# Patient Record
Sex: Female | Born: 1985 | Race: White | Hispanic: No | Marital: Single | Smoking: Former smoker
Health system: Southern US, Community
[De-identification: ages and names within clinical notes are randomized; demographics above are authoritative.]

## PROBLEM LIST (undated history)

## (undated) DIAGNOSIS — T7840XA Allergy, unspecified, initial encounter: Secondary | ICD-10-CM

## (undated) DIAGNOSIS — G8929 Other chronic pain: Secondary | ICD-10-CM

## (undated) DIAGNOSIS — G4700 Insomnia, unspecified: Secondary | ICD-10-CM

## (undated) DIAGNOSIS — M542 Cervicalgia: Secondary | ICD-10-CM

## (undated) DIAGNOSIS — J45909 Unspecified asthma, uncomplicated: Secondary | ICD-10-CM

## (undated) DIAGNOSIS — D649 Anemia, unspecified: Secondary | ICD-10-CM

## (undated) DIAGNOSIS — M797 Fibromyalgia: Secondary | ICD-10-CM

## (undated) DIAGNOSIS — E063 Autoimmune thyroiditis: Secondary | ICD-10-CM

## (undated) DIAGNOSIS — K219 Gastro-esophageal reflux disease without esophagitis: Secondary | ICD-10-CM

## (undated) DIAGNOSIS — E079 Disorder of thyroid, unspecified: Secondary | ICD-10-CM

## (undated) DIAGNOSIS — M5136 Other intervertebral disc degeneration, lumbar region: Secondary | ICD-10-CM

## (undated) DIAGNOSIS — S060XAA Concussion with loss of consciousness status unknown, initial encounter: Secondary | ICD-10-CM

## (undated) DIAGNOSIS — F419 Anxiety disorder, unspecified: Secondary | ICD-10-CM

## (undated) DIAGNOSIS — M51369 Other intervertebral disc degeneration, lumbar region without mention of lumbar back pain or lower extremity pain: Secondary | ICD-10-CM

## (undated) HISTORY — DX: Anxiety disorder, unspecified: F41.9

## (undated) HISTORY — PX: UPPER GI ENDOSCOPY: SHX6162

## (undated) HISTORY — DX: Autoimmune thyroiditis: E06.3

## (undated) HISTORY — DX: Allergy, unspecified, initial encounter: T78.40XA

## (undated) HISTORY — DX: Unspecified asthma, uncomplicated: J45.909

## (undated) HISTORY — DX: Insomnia, unspecified: G47.00

## (undated) HISTORY — DX: Concussion with loss of consciousness status unknown, initial encounter: S06.0XAA

## (undated) HISTORY — PX: TONSILLECTOMY: SUR1361

## (undated) HISTORY — PX: OVARIAN CYST SURGERY: SHX726

## (undated) HISTORY — DX: Fibromyalgia: M79.7

## (undated) HISTORY — PX: OTHER SURGICAL HISTORY: SHX169

---
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
## 2009-05-24 ENCOUNTER — Encounter: Admission: RE | Admit: 2009-05-24 | Discharge: 2009-05-24 | Payer: Self-pay | Admitting: Family Medicine

## 2009-12-01 DIAGNOSIS — G8929 Other chronic pain: Secondary | ICD-10-CM | POA: Insufficient documentation

## 2009-12-03 ENCOUNTER — Ambulatory Visit (HOSPITAL_COMMUNITY): Admission: RE | Admit: 2009-12-03 | Discharge: 2009-12-03 | Payer: Self-pay | Admitting: Gastroenterology

## 2009-12-21 ENCOUNTER — Encounter: Admission: RE | Admit: 2009-12-21 | Discharge: 2009-12-21 | Payer: Self-pay | Admitting: Gastroenterology

## 2010-01-01 DIAGNOSIS — N83209 Unspecified ovarian cyst, unspecified side: Secondary | ICD-10-CM | POA: Insufficient documentation

## 2010-01-01 DIAGNOSIS — K900 Celiac disease: Secondary | ICD-10-CM | POA: Insufficient documentation

## 2010-01-18 ENCOUNTER — Ambulatory Visit (HOSPITAL_COMMUNITY): Admission: RE | Admit: 2010-01-18 | Discharge: 2010-01-19 | Payer: Self-pay | Admitting: Obstetrics and Gynecology

## 2010-02-21 ENCOUNTER — Emergency Department (HOSPITAL_COMMUNITY): Admission: EM | Admit: 2010-02-21 | Discharge: 2010-02-21 | Payer: Self-pay | Admitting: Emergency Medicine

## 2011-02-20 LAB — COMPREHENSIVE METABOLIC PANEL
Alkaline Phosphatase: 52 U/L (ref 39–117)
BUN: 6 mg/dL (ref 6–23)
Chloride: 108 mEq/L (ref 96–112)
GFR calc non Af Amer: >60 mL/min (ref >60)
Glucose, Bld: 97 mg/dL (ref 70–99)
Potassium: 4.4 mEq/L (ref 3.5–5.1)
Total Bilirubin: 0.8 mg/dL (ref 0.3–1.2)

## 2011-02-20 LAB — CBC
HCT: 25.6 % — ABNORMAL LOW (ref 36.0–46.0)
HCT: 40.8 % (ref 36.0–46.0)
Hemoglobin: 10.1 g/dL — ABNORMAL LOW (ref 12.0–15.0)
Hemoglobin: 13.8 g/dL (ref 12.0–15.0)
Hemoglobin: 8.6 g/dL — ABNORMAL LOW (ref 12.0–15.0)
Hemoglobin: 9.6 g/dL — ABNORMAL LOW (ref 12.0–15.0)
MCHC: 33.5 g/dL (ref 30.0–36.0)
MCHC: 33.7 g/dL (ref 30.0–36.0)
MCV: 96.2 fL (ref 78.0–100.0)
MCV: 97.8 fL (ref 78.0–100.0)
Platelets: 152 10*3/uL (ref 150–400)
Platelets: 208 10*3/uL (ref 150–400)
RBC: 2.62 MIL/uL — ABNORMAL LOW (ref 3.87–5.11)
RBC: 2.99 MIL/uL — ABNORMAL LOW (ref 3.87–5.11)
RDW: 12.8 % (ref 11.5–15.5)
WBC: 11.4 10*3/uL — ABNORMAL HIGH (ref 4.0–10.5)
WBC: 5.1 10*3/uL (ref 4.0–10.5)
WBC: 6.3 10*3/uL (ref 4.0–10.5)

## 2011-02-20 LAB — TYPE AND SCREEN: ABO/RH(D): O POS

## 2011-02-20 LAB — PREGNANCY, URINE: Preg Test, Ur: NEGATIVE

## 2011-02-20 LAB — ABO/RH: ABO/RH(D): O POS

## 2011-02-24 LAB — DIFFERENTIAL
Basophils Absolute: 0 10*3/uL (ref 0.0–0.1)
Basophils Relative: 0 % (ref 0–1)
Eosinophils Absolute: 0.3 10*3/uL (ref 0.0–0.7)
Eosinophils Relative: 6 % — ABNORMAL HIGH (ref 0–5)
Lymphocytes Relative: 29 % (ref 12–46)
Lymphs Abs: 1.5 10*3/uL (ref 0.7–4.0)
Monocytes Absolute: 0.4 10*3/uL (ref 0.1–1.0)
Monocytes Relative: 7 % (ref 3–12)
Neutro Abs: 2.9 10*3/uL (ref 1.7–7.7)
Neutrophils Relative %: 58 % (ref 43–77)

## 2011-02-24 LAB — COMPREHENSIVE METABOLIC PANEL
ALT: 22 U/L (ref 0–35)
AST: 25 U/L (ref 0–37)
Albumin: 4.3 g/dL (ref 3.5–5.2)
Alkaline Phosphatase: 65 U/L (ref 39–117)
BUN: 10 mg/dL (ref 6–23)
CO2: 26 mEq/L (ref 19–32)
Calcium: 9.4 mg/dL (ref 8.4–10.5)
Chloride: 108 mEq/L (ref 96–112)
Creatinine, Ser: 0.47 mg/dL (ref 0.4–1.2)
GFR calc Af Amer: >60 mL/min (ref >60)
GFR calc non Af Amer: >60 mL/min (ref >60)
Glucose, Bld: 99 mg/dL (ref 70–99)
Potassium: 4.1 mEq/L (ref 3.5–5.1)
Sodium: 137 mEq/L (ref 135–145)
Total Bilirubin: 0.4 mg/dL (ref 0.3–1.2)
Total Protein: 7.2 g/dL (ref 6.0–8.3)

## 2011-02-24 LAB — POCT PREGNANCY, URINE: Preg Test, Ur: NEGATIVE

## 2011-02-24 LAB — CBC
HCT: 38.4 % (ref 36.0–46.0)
Hemoglobin: 12.7 g/dL (ref 12.0–15.0)
MCHC: 33.1 g/dL (ref 30.0–36.0)
MCV: 96.2 fL (ref 78.0–100.0)
Platelets: 234 10*3/uL (ref 150–400)
RBC: 3.99 MIL/uL (ref 3.87–5.11)
RDW: 13.5 % (ref 11.5–15.5)
WBC: 5 10*3/uL (ref 4.0–10.5)

## 2011-02-24 LAB — URINE MICROSCOPIC-ADD ON

## 2011-02-24 LAB — URINALYSIS, ROUTINE W REFLEX MICROSCOPIC
Bilirubin Urine: NEGATIVE
Glucose, UA: NEGATIVE mg/dL
Ketones, ur: NEGATIVE mg/dL
Protein, ur: NEGATIVE mg/dL

## 2011-02-24 LAB — LIPASE, BLOOD: Lipase: 22 U/L (ref 11–59)

## 2012-06-13 ENCOUNTER — Ambulatory Visit: Payer: BC Managed Care – PPO

## 2012-06-13 ENCOUNTER — Ambulatory Visit (INDEPENDENT_AMBULATORY_CARE_PROVIDER_SITE_OTHER): Payer: BC Managed Care – PPO | Admitting: Emergency Medicine

## 2012-06-13 VITALS — BP 100/60 | HR 73 | Temp 98.0°F | Resp 16 | Ht 68.0 in | Wt 151.6 lb

## 2012-06-13 DIAGNOSIS — R000 Tachycardia, unspecified: Secondary | ICD-10-CM

## 2012-06-13 DIAGNOSIS — J45909 Unspecified asthma, uncomplicated: Secondary | ICD-10-CM

## 2012-06-13 DIAGNOSIS — F411 Generalized anxiety disorder: Secondary | ICD-10-CM

## 2012-06-13 DIAGNOSIS — F419 Anxiety disorder, unspecified: Secondary | ICD-10-CM

## 2012-06-13 MED ORDER — IPRATROPIUM-ALBUTEROL 0.5-2.5 (3) MG/3ML IN SOLN: 3.0000 mL | Freq: Four times a day (QID) | RESPIRATORY_TRACT | Status: DC | PRN

## 2012-06-13 NOTE — Progress Notes (Signed)
  Subjective:    Patient ID: Victoria Larsen, female    DOB: 1986/03/02, 26 y.o.   MRN: [id number]  HPI 26 year old female presents with chest congestion started 4 pm yesterday. Patient could not sleep last night. Productive cough started this morning. History of asthma. Rapid heartbeat. Used nebulizer twice yesterday with no relief.  Wears binder around chest to reduce breast size. Does not sleep in binder  Review of Systems     Objective:   Physical Exam HEENT exam is unremarkable her neck was supple her chest was clear to my auscultation. Heart was regular rate without murmurs  [facility] reading (PRIMARY) by  Dr.Treonna Klee chest x-ray shows no acute disease  EKG is normal       Assessment & Plan:   Testing patient going through a number of issues. She has moved here from [facility] plan to move to [place]. We'll plan on an evaluation by an aggressive therapies. She will continue her Xanax as needed and prescription for duo nebs called in for episodes of bronchospasm.

## 2012-07-21 ENCOUNTER — Ambulatory Visit (INDEPENDENT_AMBULATORY_CARE_PROVIDER_SITE_OTHER): Payer: BC Managed Care – PPO | Admitting: Family Medicine

## 2012-07-21 VITALS — BP 82/66 | HR 69 | Temp 97.7°F | Resp 16 | Ht 68.18 in | Wt 148.6 lb

## 2012-07-21 DIAGNOSIS — N760 Acute vaginitis: Secondary | ICD-10-CM

## 2012-07-21 DIAGNOSIS — J0110 Acute frontal sinusitis, unspecified: Secondary | ICD-10-CM

## 2012-07-21 DIAGNOSIS — R350 Frequency of micturition: Secondary | ICD-10-CM

## 2012-07-21 DIAGNOSIS — Z7251 High risk heterosexual behavior: Secondary | ICD-10-CM

## 2012-07-21 LAB — POCT URINALYSIS DIPSTICK
Nitrite, UA: NEGATIVE
Spec Grav, UA: >=1.03
pH, UA: 5.5

## 2012-07-21 LAB — POCT UA - MICROSCOPIC ONLY
Casts, Ur, LPF, POC: NEGATIVE
Crystals, Ur, HPF, POC: NEGATIVE

## 2012-07-21 LAB — POCT WET PREP WITH KOH
RBC Wet Prep HPF POC: NEGATIVE
Yeast Wet Prep HPF POC: NEGATIVE

## 2012-07-21 MED ORDER — AMBULATORY NON FORMULARY MEDICATION: Status: DC

## 2012-07-21 MED ORDER — AMOXICILLIN-POT CLAVULANATE 875-125 MG PO TABS: 1.0000 | ORAL_TABLET | Freq: Two times a day (BID) | ORAL | Status: AC

## 2012-07-21 MED ORDER — METRONIDAZOLE 0.75 % VA GEL: 1.0000 | Freq: Two times a day (BID) | VAGINAL | Status: DC

## 2012-07-21 NOTE — Progress Notes (Signed)
Subjective:    Patient ID: Victoria Larsen, female    DOB: [birth year], 26 y.o.   MRN: [id number]  HPI This 26 y.o. female presents for evaluation of the following:    1.  Cold symptoms:  Onset June 2013; intermittent symptoms; worsening over past month; really congested in chest.  No fever/chills/sweats.  +HA; +sinus pressure; +ST intermittent; +raspy voice; +runny nose; +nasal congestion; +PND.  +no cough; no sputum production; taking Mucinex PRN.  Mild wheezing; history of asthma; increased Albuterol use; using twice per week.  No v/d.  Taking Loratadine daily; using nasal spray daily.  2.  Vaginal discharge:  Onset five days ago; itching and pain; urine is really cloudy and strong odor.  More dryness.  No change in soaps.  Sexually active; new partner.  Female partner.  No unusual lubricants recently.  No dysuria.  +redness mild.  History of recurrent yeast infections.  No OTC.  Requesting STD screening.  No female partners.    No history of STDs.  History of recurrent BV also.  Had unusual reaction to H. Pylori treatment in past; felt side effect to oral Metronidazole; has used Metrogel vaginally without difficulty in past.  No use of boric acid suppositories.  PMH: fibromyalgia, asthma, allergic rhinitis, insomnia, anxiety Other providers: [facility]. Psur: ovarian cyst rupture 2011. All:  Metronidazole Meds:  Dulera, Albuterol, nebulizer, Xanax PRN, Ambien. Social:    Single; dating casually; working yard work; no tobacco; no alcohol; no drugs; +exercise.    Review of Systems  Constitutional: Negative for fever, chills and diaphoresis.  HENT: Positive for congestion, rhinorrhea, sneezing and postnasal drip. Negative for ear pain, nosebleeds, facial swelling, neck pain and ear discharge.   Eyes: Negative for discharge and itching.  Respiratory: Negative for cough, shortness of breath and wheezing.   Genitourinary: Positive for vaginal pain and pelvic pain. Negative for  dysuria, urgency, frequency, hematuria, flank pain, vaginal bleeding, vaginal discharge, genital sores and menstrual problem.       +VAGINAL ITCHING       Objective:   Physical Exam  Nursing note and vitals reviewed. Constitutional: She is oriented to person, place, and time. She appears well-developed and well-nourished. No distress.  HENT:  Head: Normocephalic and atraumatic.  Right Ear: External ear normal.  Left Ear: External ear normal.  Nose: Nose normal.  Mouth/Throat: Oropharynx is clear and moist. No oropharyngeal exudate.       +TTP FRONTAL SINUS.  Eyes: Conjunctivae and EOM are normal. Pupils are equal, round, and reactive to light.  Neck: Normal range of motion. Neck supple. No thyromegaly present.  Cardiovascular: Normal rate, regular rhythm and normal heart sounds.   No murmur heard. Pulmonary/Chest: Effort normal and breath sounds normal. No respiratory distress. She has no wheezes.  Abdominal: Soft. Bowel sounds are normal. She exhibits no distension and no mass. There is no tenderness. There is no rebound and no guarding.  Genitourinary: Uterus normal. Vaginal discharge found.       MILD ERYTHEMA LABIA MINORA; MILD SCANT VAGINAL DISCHARGE CLEAR-WHITE-YELLOW WITH MILD ODOR; NO CMT; NO ADNEXAL MASSES OR TTP.  Lymphadenopathy:    She has no cervical adenopathy.  Neurological: She is alert and oriented to person, place, and time. No cranial nerve deficit. She exhibits normal muscle tone.  Skin: Skin is warm and dry.  Psychiatric: She has a normal mood and affect. Her behavior is normal. Judgment and thought content normal.       Results for orders placed in visit  on 07/21/12  POCT URINALYSIS DIPSTICK      Component Value Range   Color, UA yellow     Clarity, UA cloudy     Glucose, UA neg     Bilirubin, UA small     Ketones, UA neg     Spec Grav, UA >=1.030     Blood, UA trace     pH, UA 5.5     Protein, UA 30     Urobilinogen, UA 0.2     Nitrite, UA neg      Leukocytes, UA Negative    POCT UA - MICROSCOPIC ONLY      Component Value Range   WBC, Ur, HPF, POC 2-3     RBC, urine, microscopic 0-1     Bacteria, U Microscopic 2+     Mucus, UA large     Epithelial cells, urine per micros 7-14     Crystals, Ur, HPF, POC neg     Casts, Ur, LPF, POC neg     Yeast, UA neg    POCT WET PREP WITH KOH      Component Value Range   Trichomonas, UA Negative     Clue Cells Wet Prep HPF POC 6-10     Epithelial Wet Prep HPF POC 6-10     Yeast Wet Prep HPF POC neg     Bacteria Wet Prep HPF POC 3+     RBC Wet Prep HPF POC neg     WBC Wet Prep HPF POC 4-8     KOH Prep POC Negative      Assessment & Plan:   1. Frequency of urination  POCT urinalysis dipstick, POCT UA - Microscopic Only, Urine culture, POCT Wet Prep with KOH  2. High risk sexual behavior  GC/chlamydia probe amp, genital, HIV antibody, RPR  3. Vaginitis and vulvovaginitis  AMBULATORY NON FORMULARY MEDICATION  4. Sinusitis, acute frontal  CBC with Differential, amoxicillin-clavulanate (AUGMENTIN) 875-125 MG per tablet   1.  High Risk Sexual Behavior/STD screening: new.  Obtain GC/Chlam, RPR, HIV.  HIV counseling provided during visit and pt expressed understanding. 2.  Vulvovaginitis/BV: New and recurrent issue for patient.  Treat with Metrogel vaginal suppositories.  Possible side effect to oral Metronidazole but questionable; pt to call if develops symptoms with Metrogel vaginal cream.  Also provided with rx for Boric acid suppositories.   3.  Acute frontal sinusitis: New.  Rx for Augmentin provided.  Continue antihistamine, nasal spray.    Meds ordered this encounter  Medications  . zolpidem (AMBIEN) 10 MG tablet    Sig: Take 10 mg by mouth at bedtime as needed.  Marland Kitchen amoxicillin-clavulanate (AUGMENTIN) 875-125 MG per tablet    Sig: Take 1 tablet by mouth 2 (two) times daily.    Dispense:  20 tablet    Refill:  0  . metroNIDAZOLE (METROGEL VAGINAL) 0.75 % vaginal gel    Sig: Place 1  Applicatorful vaginally 2 (two) times daily.    Dispense:  70 g    Refill:  0  . AMBULATORY NON FORMULARY MEDICATION    Sig: Boric Acid Suppository 600 mg Insert 1 PV twice weekly to maintain vaginal flora    Dispense:  10 suppository    Refill:  5

## 2012-07-21 NOTE — Patient Instructions (Addendum)
Bacterial Vaginosis Bacterial vaginosis (BV) is a vaginal infection where the normal balance of bacteria in the vagina is disrupted. The normal balance is then replaced by an overgrowth of certain bacteria. There are several different kinds of bacteria that can cause BV. BV is the most common vaginal infection in women of childbearing age. CAUSES   The cause of BV is not fully understood. BV develops when there is an increase or imbalance of harmful bacteria.   Some activities or behaviors can upset the normal balance of bacteria in the vagina and put women at increased risk including:   Having a new sex partner or multiple sex partners.   Douching.   Using an intrauterine device (IUD) for contraception.   It is not clear what role sexual activity plays in the development of BV. However, women that have never had sexual intercourse are rarely infected with BV.  Women do not get BV from toilet seats, bedding, swimming pools or from touching objects around them.  SYMPTOMS   Grey vaginal discharge.   A fish-like odor with discharge, especially after sexual intercourse.   Itching or burning of the vagina and vulva.   Burning or pain with urination.   Some women have no signs or symptoms at all.  DIAGNOSIS  Your caregiver must examine the vagina for signs of BV. Your caregiver will perform lab tests and look at the sample of vaginal fluid through a microscope. They will look for bacteria and abnormal cells (clue cells), a pH test higher than 4.5, and a positive amine test all associated with BV.  RISKS AND COMPLICATIONS   Pelvic inflammatory disease (PID).   Infections following gynecology surgery.   Developing HIV.   Developing herpes virus.  TREATMENT  Sometimes BV will clear up without treatment. However, all women with symptoms of BV should be treated to avoid complications, especially if gynecology surgery is planned. Female partners generally do not need to be treated. However,  BV may spread between female sex partners so treatment is helpful in preventing a recurrence of BV.   BV may be treated with antibiotics. The antibiotics come in either pill or vaginal cream forms. Either can be used with nonpregnant or pregnant women, but the recommended dosages differ. These antibiotics are not harmful to the baby.   BV can recur after treatment. If this happens, a second round of antibiotics will often be prescribed.   Treatment is important for pregnant women. If not treated, BV can cause a premature delivery, especially for a pregnant woman who had a premature birth in the past. All pregnant women who have symptoms of BV should be checked and treated.   For chronic reoccurrence of BV, treatment with a type of prescribed gel vaginally twice a week is helpful.  HOME CARE INSTRUCTIONS   Finish all medication as directed by your caregiver.   Do not have sex until treatment is completed.   Tell your sexual partner that you have a vaginal infection. They should see their caregiver and be treated if they have problems, such as a mild rash or itching.   Practice safe sex. Use condoms. Only have 1 sex partner.  PREVENTION  Basic prevention steps can help reduce the risk of upsetting the natural balance of bacteria in the vagina and developing BV:  Do not have sexual intercourse (be abstinent).   Do not douche.   Use all of the medicine prescribed for treatment of BV, even if the signs and symptoms go away.     Tell your sex partner if you have BV. That way, they can be treated, if needed, to prevent reoccurrence.  SEEK MEDICAL CARE IF:   Your symptoms are not improving after 3 days of treatment.   You have increased discharge, pain, or fever.  MAKE SURE YOU:   Understand these instructions.   Will watch your condition.   Will get help right away if you are not doing well or get worse.  FOR MORE INFORMATION  Division of STD Prevention (DSTDP), Centers for Disease  Control and Prevention: www.cdc.gov/std American Social Health Association (ASHA): www.ashastd.org  Document Released: 11/17/2005 Document Revised: 11/06/2011 Document Reviewed: 05/10/2009 Exit[employer] Patient Information 2012 [employer]. 

## 2012-07-22 ENCOUNTER — Telehealth: Payer: Self-pay

## 2012-07-22 LAB — CBC WITH DIFFERENTIAL/PLATELET
Eosinophils Relative: 8 % — ABNORMAL HIGH (ref 0–5)
HCT: 40.7 % (ref 36.0–46.0)
Hemoglobin: 14 g/dL (ref 12.0–15.0)
Lymphocytes Relative: 33 % (ref 12–46)
Lymphs Abs: 2 10*3/uL (ref 0.7–4.0)
MCV: 92.9 fL (ref 78.0–100.0)
Monocytes Absolute: 0.6 10*3/uL (ref 0.1–1.0)
Neutro Abs: 2.9 10*3/uL (ref 1.7–7.7)
RBC: 4.38 MIL/uL (ref 3.87–5.11)
WBC: 5.9 10*3/uL (ref 4.0–10.5)

## 2012-07-22 LAB — HIV ANTIBODY (ROUTINE TESTING W REFLEX): HIV: NONREACTIVE

## 2012-07-22 NOTE — Telephone Encounter (Signed)
1. High Risk Sexual Behavior/STD screening: new. Obtain GC/Chlam, RPR, HIV. HIV counseling provided during visit and pt expressed understanding.  2. Vulvovaginitis/BV: New and recurrent issue for patient. Treat with Metrogel vaginal suppositories. Possible side effect to oral Metronidazole but questionable; pt to call if develops symptoms with Metrogel vaginal cream. Also provided with rx for Boric acid suppositories.  3. Acute frontal sinusitis: New. Rx for Augmentin provided. Continue antihistamine, nasal spray.   From office visit yesterday please advise.

## 2012-07-22 NOTE — Telephone Encounter (Signed)
Pt saw Dr. Katrinka Blazing yesterday who prescribed metroNIDAZOLE (METROGEL VAGINAL) 0.75 % vaginal gel [[id number]]. She has had an allergic rxn to the gel before and Dr. Katrinka Blazing said she could prescribe something different if she would prefer.  Pt has changed her mind and would like the alternative.  Best [phone]  [facility] [address], [place] - [address]

## 2012-07-22 NOTE — Progress Notes (Signed)
Reviewed and agree.

## 2012-07-22 NOTE — Telephone Encounter (Signed)
Note says she had a possible reaction to ORAL metronidazole but that she used the gel without problem. Please clarify what the patient wants.

## 2012-07-23 LAB — URINE CULTURE

## 2012-07-23 NOTE — Telephone Encounter (Signed)
Pt states that she is having a problem with with the gel that was prescribed and would like something else prescribed. Best# [phone]

## 2012-07-25 MED ORDER — CLINDAMYCIN PHOSPHATE 100 MG VA SUPP: 100.0000 mg | Freq: Every day | VAGINAL | Status: AC

## 2012-07-25 MED ORDER — MOMETASONE FURO-FORMOTEROL FUM 100-5 MCG/ACT IN AERO: 2.0000 | INHALATION_SPRAY | Freq: Two times a day (BID) | RESPIRATORY_TRACT | Status: DC

## 2012-07-25 NOTE — Telephone Encounter (Signed)
LMOM RX sent in. 

## 2012-07-25 NOTE — Telephone Encounter (Signed)
Clindamycin vaginal suppository sent.

## 2017-12-22 ENCOUNTER — Ambulatory Visit: Payer: BC Managed Care – PPO | Admitting: Emergency Medicine

## 2017-12-22 ENCOUNTER — Other Ambulatory Visit: Payer: Self-pay

## 2017-12-22 ENCOUNTER — Encounter: Payer: Self-pay | Admitting: Emergency Medicine

## 2017-12-22 VITALS — BP 96/64 | HR 91 | Temp 98.4°F | Resp 16 | Ht 68.25 in | Wt 163.0 lb

## 2017-12-22 DIAGNOSIS — Z8709 Personal history of other diseases of the respiratory system: Secondary | ICD-10-CM | POA: Diagnosis not present

## 2017-12-22 DIAGNOSIS — R6889 Other general symptoms and signs: Secondary | ICD-10-CM | POA: Diagnosis not present

## 2017-12-22 DIAGNOSIS — J111 Influenza due to unidentified influenza virus with other respiratory manifestations: Secondary | ICD-10-CM

## 2017-12-22 LAB — POC INFLUENZA A&B (BINAX/QUICKVUE)
INFLUENZA A, POC: NEGATIVE
INFLUENZA B, POC: NEGATIVE

## 2017-12-22 MED ORDER — AZITHROMYCIN 250 MG PO TABS: ORAL_TABLET | ORAL | 0 refills | Status: DC

## 2017-12-22 MED ORDER — PREDNISONE 20 MG PO TABS: 40.0000 mg | ORAL_TABLET | Freq: Every day | ORAL | 0 refills | Status: AC

## 2017-12-22 MED ORDER — HYDROCODONE-ACETAMINOPHEN 5-325 MG PO TABS: 1.0000 | ORAL_TABLET | Freq: Four times a day (QID) | ORAL | 0 refills | Status: DC | PRN

## 2017-12-22 NOTE — Patient Instructions (Addendum)
     IF you received an x-ray today, you will receive an invoice from [facility]. Please contact [facility] at [phone] with questions or concerns regarding your invoice.   IF you received labwork today, you will receive an invoice from [facility]. Please contact [facility] at [phone] with questions or concerns regarding your invoice.   Our billing staff will not be able to assist you with questions regarding bills from these companies.  You will be contacted with the lab results as soon as they are available. The fastest way to get your results is to activate your My Chart account. Instructions are located on the last page of this paperwork. If you have not heard from us regarding the results in 2 weeks, please contact this office.      Influenza, Adult Influenza ("the flu") is an infection in the lungs, nose, and throat (respiratory tract). It is caused by a virus. The flu causes many common cold symptoms, as well as a high fever and body aches. It can make you feel very sick. The flu spreads easily from person to person (is contagious). Getting a flu shot (influenza vaccination) every year is the best way to prevent the flu. Follow these instructions at home:  Take over-the-counter and prescription medicines only as told by your doctor.  Use a cool mist humidifier to add moisture (humidity) to the air in your home. This can make it easier to breathe.  Rest as needed.  Drink enough fluid to keep your pee (urine) clear or pale yellow.  Cover your mouth and nose when you cough or sneeze.  Wash your hands with soap and water often, especially after you cough or sneeze. If you cannot use soap and water, use hand sanitizer.  Stay home from work or school as told by your doctor. Unless you are visiting your doctor, try to avoid leaving home until your fever has been gone for 24 hours without the use of medicine.  Keep all follow-up visits as told by your doctor.  This is important. How is this prevented?  Getting a yearly (annual) flu shot is the best way to avoid getting the flu. You may get the flu shot in late summer, fall, or winter. Ask your doctor when you should get your flu shot.  Wash your hands often or use hand sanitizer often.  Avoid contact with people who are sick during cold and flu season.  Eat healthy foods.  Drink plenty of fluids.  Get enough sleep.  Exercise regularly. Contact a doctor if:  You get new symptoms.  You have:  Chest pain.  Watery poop (diarrhea).  A fever.  Your cough gets worse.  You start to have more mucus.  You feel sick to your stomach (nauseous).  You throw up (vomit). Get help right away if:  You start to be short of breath or have trouble breathing.  Your skin or nails turn a bluish color.  You have very bad pain or stiffness in your neck.  You get a sudden headache.  You get sudden pain in your face or ear.  You cannot stop throwing up. This information is not intended to replace advice given to you by your health care provider. Make sure you discuss any questions you have with your health care provider. Document Released: 08/26/2008 Document Revised: 04/24/2016 Document Reviewed: 09/11/2015 Elsevier Interactive Patient Education  2017 [employer]  

## 2017-12-22 NOTE — Progress Notes (Signed)
Victoria Larsen 32 y.o.   Chief Complaint  Patient presents with  . Cough    2 days ago - productive pale yellow  . Sore Throat    with body ache  . Establish Care    HISTORY OF PRESENT ILLNESS: This is a 32 y.o. female complaining of acute onset flu like symptoms 2 days ago.  Influenza  This is a new problem. The current episode started in the past 7 days. The problem has been rapidly worsening. Associated symptoms include chills, congestion, coughing, fatigue, a fever, headaches, myalgias, a sore throat and weakness. Pertinent negatives include no abdominal pain, arthralgias, chest pain, nausea, neck pain, rash, swollen glands, urinary symptoms or vomiting.     Prior to Admission medications   Medication Sig Start Date End Date Taking? Authorizing Provider  albuterol (PROVENTIL HFA;VENTOLIN HFA) 108 (90 BASE) MCG/ACT inhaler Inhale 2 puffs into the lungs every 6 (six) hours as needed.   Yes [provider]  albuterol (PROVENTIL) (2.5 MG/3ML) 0.083% nebulizer solution Take 2.5 mg by nebulization every 6 (six) hours as needed.   Yes [provider]  AMBULATORY NON FORMULARY MEDICATION Boric Acid Suppository 600 mg Insert 1 PV twice weekly to maintain vaginal flora 07/21/12  Yes Ethelda Chick, MD  cyclobenzaprine (FLEXERIL) 10 MG tablet Take 10 mg by mouth as needed for muscle spasms.   Yes [provider]  ipratropium-albuterol (DUONEB) 0.5-2.5 (3) MG/3ML SOLN Take 3 mLs by nebulization every 6 (six) hours as needed. 06/13/12  Yes Collene Gobble, MD  ALPRAZolam Prudy Feeler) 0.5 MG tablet Take 0.5 mg by mouth at bedtime as needed.    [provider]  mometasone-formoterol (DULERA) 100-5 MCG/ACT AERO Inhale 2 puffs into the lungs 2 (two) times daily. Patient not taking: Reported on 12/22/2017 07/25/12   Porfirio Oar, PA-C  zolpidem (AMBIEN) 10 MG tablet Take 10 mg by mouth at bedtime as needed.    [provider]    Allergies  Allergen  Reactions  . Metronidazole   . Penicillins Rash    infant    There are no active problems to display for this patient.   Past Medical History:  Diagnosis Date  . Allergy   . Anxiety   . Asthma   . Fibromyalgia   . Insomnia     Past Surgical History:  Procedure Laterality Date  . OVARIAN CYST SURGERY      Social History   Socioeconomic History  . Marital status: Single    Spouse name: Not on file  . Number of children: Not on file  . Years of education: Not on file  . Highest education level: Not on file  Social Needs  . Financial resource strain: Not on file  . Food insecurity - worry: Not on file  . Food insecurity - inability: Not on file  . Transportation needs - medical: Not on file  . Transportation needs - non-medical: Not on file  Occupational History  . Not on file  Tobacco Use  . Smoking status: Former Games developer  . Smokeless tobacco: Never Used  Substance and Sexual Activity  . Alcohol use: No  . Drug use: No  . Sexual activity: Yes    Comment: Same sex partner  Other Topics Concern  . Not on file  Social History Narrative   Marital status: single; dating same sexual partner casually.  Children: none.  Employment: Landscaping.  Tobacco: none  Alcohol: none Drugs: none.  Sexually active: yes.  No family history on file.   Review of Systems  Constitutional: Positive for chills, fatigue and fever.  HENT: Positive for congestion and sore throat. Negative for ear pain.   Respiratory: Positive for cough.   Cardiovascular: Negative for chest pain.  Gastrointestinal: Negative for abdominal pain, nausea and vomiting.  Genitourinary: Negative for dysuria and hematuria.  Musculoskeletal: Positive for myalgias. Negative for arthralgias and neck pain.  Skin: Negative for rash.  Neurological: Positive for weakness and headaches. Negative for dizziness.  Endo/Heme/Allergies: Negative.   All other systems reviewed and are negative.   Vitals:   12/22/17  1640  BP: 96/64  Pulse: 91  Resp: 16  Temp: 98.4 F (36.9 C)  SpO2: 97%    Physical Exam  Constitutional: She is oriented to person, place, and time. She appears well-developed and well-nourished.  HENT:  Head: Normocephalic and atraumatic.  Mouth/Throat: Uvula is midline. No oropharyngeal exudate, posterior oropharyngeal edema or tonsillar abscesses.  Eyes: Conjunctivae and EOM are normal. Pupils are equal, round, and reactive to light.  Neck: Normal range of motion. Neck supple.  Cardiovascular: Normal rate, regular rhythm and normal heart sounds.  Pulmonary/Chest: Effort normal and breath sounds normal.  Abdominal: Soft. Bowel sounds are normal.  Musculoskeletal: Normal range of motion. She exhibits no edema.  Lymphadenopathy:    She has cervical adenopathy.  Neurological: She is alert and oriented to person, place, and time. No sensory deficit. She exhibits normal muscle tone.  Skin: Skin is warm and dry. Capillary refill takes less than 2 seconds.  Psychiatric: She has a normal mood and affect. Her behavior is normal.  Vitals reviewed.    ASSESSMENT & PLAN:  Victoria Larsen was seen today for cough, sore throat and establish care.  Diagnoses and all orders for this visit:  Influenza with respiratory manifestation -     azithromycin (ZITHROMAX) 250 MG tablet; Sig as indicated  Flu-like symptoms -     POC Influenza A&B(BINAX/QUICKVUE) -     HYDROcodone-acetaminophen (NORCO) 5-325 MG tablet; Take 1 tablet by mouth every 6 (six) hours as needed for moderate pain.  History of asthma -     predniSONE (DELTASONE) 20 MG tablet; Take 2 tablets (40 mg total) by mouth daily with breakfast for 5 days.  Other orders -     Cancel: Tdap vaccine greater than or equal to 7yo IM     Patient Instructions       IF you received an x-ray today, you will receive an invoice from [facility]. Please contact [facility] at [phone] with questions or concerns regarding  your invoice.   IF you received labwork today, you will receive an invoice from [facility]. Please contact [facility] at [phone] with questions or concerns regarding your invoice.   Our billing staff will not be able to assist you with questions regarding bills from these companies.  You will be contacted with the lab results as soon as they are available. The fastest way to get your results is to activate your My Chart account. Instructions are located on the last page of this paperwork. If you have not heard from [place] regarding the results in 2 weeks, please contact this office.     Influenza, Adult Influenza ("the flu") is an infection in the lungs, nose, and throat (respiratory tract). It is caused by a virus. The flu causes many common cold symptoms, as well as a high fever and body aches. It can make you feel very sick. The flu spreads easily  from person to person (is contagious). Getting a flu shot (influenza vaccination) every year is the best way to prevent the flu. Follow these instructions at home:  Take over-the-counter and prescription medicines only as told by your doctor.  Use a cool mist humidifier to add moisture (humidity) to the air in your home. This can make it easier to breathe.  Rest as needed.  Drink enough fluid to keep your pee (urine) clear or pale yellow.  Cover your mouth and nose when you cough or sneeze.  Wash your hands with soap and water often, especially after you cough or sneeze. If you cannot use soap and water, use hand sanitizer.  Stay home from work or school as told by your doctor. Unless you are visiting your doctor, try to avoid leaving home until your fever has been gone for 24 hours without the use of medicine.  Keep all follow-up visits as told by your doctor. This is important. How is this prevented?  Getting a yearly (annual) flu shot is the best way to avoid getting the flu. You may get the flu shot in late summer, fall, or winter. Ask  your doctor when you should get your flu shot.  Wash your hands often or use hand sanitizer often.  Avoid contact with people who are sick during cold and flu season.  Eat healthy foods.  Drink plenty of fluids.  Get enough sleep.  Exercise regularly. Contact a doctor if:  You get new symptoms.  You have: ? Chest pain. ? Watery poop (diarrhea). ? A fever.  Your cough gets worse.  You start to have more mucus.  You feel sick to your stomach (nauseous).  You throw up (vomit). Get help right away if:  You start to be short of breath or have trouble breathing.  Your skin or nails turn a bluish color.  You have very bad pain or stiffness in your neck.  You get a sudden headache.  You get sudden pain in your face or ear.  You cannot stop throwing up. This information is not intended to replace advice given to you by your health care provider. Make sure you discuss any questions you have with your health care provider. Document Released: 08/26/2008 Document Revised: 04/24/2016 Document Reviewed: 09/11/2015 Elsevier Interactive Patient Education  2017 Elsevier Inc.     Edwina Barth, MD [facility]

## 2018-01-29 ENCOUNTER — Other Ambulatory Visit: Payer: Self-pay

## 2018-01-29 ENCOUNTER — Encounter: Payer: Self-pay | Admitting: Physician Assistant

## 2018-01-29 ENCOUNTER — Ambulatory Visit (INDEPENDENT_AMBULATORY_CARE_PROVIDER_SITE_OTHER): Payer: BC Managed Care – PPO | Admitting: Physician Assistant

## 2018-01-29 VITALS — BP 98/60 | HR 76 | Temp 97.9°F | Resp 16 | Ht 68.0 in | Wt 161.8 lb

## 2018-01-29 DIAGNOSIS — Z130 Encounter for screening for diseases of the blood and blood-forming organs and certain disorders involving the immune mechanism: Secondary | ICD-10-CM

## 2018-01-29 DIAGNOSIS — Z124 Encounter for screening for malignant neoplasm of cervix: Secondary | ICD-10-CM | POA: Diagnosis not present

## 2018-01-29 DIAGNOSIS — Z1329 Encounter for screening for other suspected endocrine disorder: Secondary | ICD-10-CM | POA: Diagnosis not present

## 2018-01-29 DIAGNOSIS — Z13220 Encounter for screening for lipoid disorders: Secondary | ICD-10-CM

## 2018-01-29 DIAGNOSIS — B9689 Other specified bacterial agents as the cause of diseases classified elsewhere: Secondary | ICD-10-CM

## 2018-01-29 DIAGNOSIS — N898 Other specified noninflammatory disorders of vagina: Secondary | ICD-10-CM

## 2018-01-29 DIAGNOSIS — N760 Acute vaginitis: Secondary | ICD-10-CM

## 2018-01-29 DIAGNOSIS — Z23 Encounter for immunization: Secondary | ICD-10-CM | POA: Diagnosis not present

## 2018-01-29 DIAGNOSIS — Z0000 Encounter for general adult medical examination without abnormal findings: Secondary | ICD-10-CM | POA: Diagnosis not present

## 2018-01-29 LAB — POCT WET + KOH PREP
Trich by wet prep: ABSENT
Yeast by KOH: ABSENT
Yeast by wet prep: ABSENT

## 2018-01-29 LAB — POCT URINALYSIS DIP (MANUAL ENTRY)
Bilirubin, UA: NEGATIVE
Glucose, UA: NEGATIVE mg/dL
Ketones, POC UA: NEGATIVE mg/dL
Leukocytes, UA: NEGATIVE
Nitrite, UA: NEGATIVE
Protein Ur, POC: NEGATIVE mg/dL
Spec Grav, UA: 1.02 (ref 1.010–1.025)
Urobilinogen, UA: 0.2 U/dL
pH, UA: 5.5 (ref 5.0–8.0)

## 2018-01-29 MED ORDER — CLINDAMYCIN HCL 300 MG PO CAPS: 300.0000 mg | ORAL_CAPSULE | Freq: Two times a day (BID) | ORAL | 0 refills | Status: AC

## 2018-01-29 MED ORDER — FLUCONAZOLE 150 MG PO TABS: 150.0000 mg | ORAL_TABLET | Freq: Once | ORAL | 0 refills | Status: AC

## 2018-01-29 NOTE — Patient Instructions (Addendum)
You are positive for BV. Start taking clindamycin (300 mg twice daily for seven days).   If you get a yeast infection after this treatment, I have written you a prescription for diflucan.   Thank you for coming in today. I hope you feel we met your needs.  Feel free to call PCP if you have any questions or further requests.  Please consider signing up for MyChart if you do not already have it, as this is a great way to communicate with me.  Best,  Whitney McVey, PA-C  Health Maintenance, Female Adopting a healthy lifestyle and getting preventive care can go a long way to promote health and wellness. Talk with your health care provider about what schedule of regular examinations is right for you. This is a good chance for you to check in with your provider about disease prevention and staying healthy. In between checkups, there are plenty of things you can do on your own. Experts have done a lot of research about which lifestyle changes and preventive measures are most likely to keep you healthy. Ask your health care provider for more information. Weight and diet Eat a healthy diet  Be sure to include plenty of vegetables, fruits, low-fat dairy products, and lean protein.  Do not eat a lot of foods high in solid fats, added sugars, or salt.  Get regular exercise. This is one of the most important things you can do for your health. ? Most adults should exercise for at least 150 minutes each week. The exercise should increase your heart rate and make you sweat (moderate-intensity exercise). ? Most adults should also do strengthening exercises at least twice a week. This is in addition to the moderate-intensity exercise.  Maintain a healthy weight  Body mass index (BMI) is a measurement that can be used to identify possible weight problems. It estimates body fat based on height and weight. Your health care provider can help determine your BMI and help you achieve or maintain a healthy  weight.  For females 65 years of age and older: ? A BMI below 18.5 is considered underweight. ? A BMI of 18.5 to 24.9 is normal. ? A BMI of 25 to 29.9 is considered overweight. ? A BMI of 30 and above is considered obese.  Watch levels of cholesterol and blood lipids  You should start having your blood tested for lipids and cholesterol at 30 years of age, then have this test every 5 years.  You may need to have your cholesterol levels checked more often if: ? Your lipid or cholesterol levels are high. ? You are older than 30 years of age. ? You are at high risk for heart disease.  Cancer screening Lung Cancer  Lung cancer screening is recommended for adults 15-34 years old who are at high risk for lung cancer because of a history of smoking.  A yearly low-dose CT scan of the lungs is recommended for people who: ? Currently smoke. ? Have quit within the past 15 years. ? Have at least a 30-pack-year history of smoking. A pack year is smoking an average of one pack of cigarettes a day for 1 year.  Yearly screening should continue until it has been 15 years since you quit.  Yearly screening should stop if you develop a health problem that would prevent you from having lung cancer treatment.  Breast Cancer  Practice breast self-awareness. This means understanding how your breasts normally appear and feel.  It also means doing  regular breast self-exams. Let your health care provider know about any changes, no matter how small.  If you are in your 20s or 30s, you should have a clinical breast exam (CBE) by a health care provider every 1-3 years as part of a regular health exam.  If you are 48 or older, have a CBE every year. Also consider having a breast X-ray (mammogram) every year.  If you have a family history of breast cancer, talk to your health care provider about genetic screening.  If you are at high risk for breast cancer, talk to your health care provider about having an  MRI and a mammogram every year.  Breast cancer gene (BRCA) assessment is recommended for women who have family members with BRCA-related cancers. BRCA-related cancers include: ? Breast. ? Ovarian. ? Tubal. ? Peritoneal cancers.  Results of the assessment will determine the need for genetic counseling and BRCA1 and BRCA2 testing.  Cervical Cancer Your health care provider may recommend that you be screened regularly for cancer of the pelvic organs (ovaries, uterus, and vagina). This screening involves a pelvic examination, including checking for microscopic changes to the surface of your cervix (Pap test). You may be encouraged to have this screening done every 3 years, beginning at age 63.  For women ages 28-65, health care providers may recommend pelvic exams and Pap testing every 3 years, or they may recommend the Pap and pelvic exam, combined with testing for human papilloma virus (HPV), every 5 years. Some types of HPV increase your risk of cervical cancer. Testing for HPV may also be done on women of any age with unclear Pap test results.  Other health care providers may not recommend any screening for nonpregnant women who are considered low risk for pelvic cancer and who do not have symptoms. Ask your health care provider if a screening pelvic exam is right for you.  If you have had past treatment for cervical cancer or a condition that could lead to cancer, you need Pap tests and screening for cancer for at least 20 years after your treatment. If Pap tests have been discontinued, your risk factors (such as having a new sexual partner) need to be reassessed to determine if screening should resume. Some women have medical problems that increase the chance of getting cervical cancer. In these cases, your health care provider may recommend more frequent screening and Pap tests.  Colorectal Cancer  This type of cancer can be detected and often prevented.  Routine colorectal cancer screening  usually begins at 30 years of age and continues through 30 years of age.  Your health care provider may recommend screening at an earlier age if you have risk factors for colon cancer.  Your health care provider may also recommend using home test kits to check for hidden blood in the stool.  A small camera at the end of a tube can be used to examine your colon directly (sigmoidoscopy or colonoscopy). This is done to check for the earliest forms of colorectal cancer.  Routine screening usually begins at age 8.  Direct examination of the colon should be repeated every 5-10 years through 30 years of age. However, you may need to be screened more often if early forms of precancerous polyps or small growths are found.  Skin Cancer  Check your skin from head to toe regularly.  Tell your health care provider about any new moles or changes in moles, especially if there is a change in a mole's  shape or color.  Also tell your health care provider if you have a mole that is larger than the size of a pencil eraser.  Always use sunscreen. Apply sunscreen liberally and repeatedly throughout the day.  Protect yourself by wearing long sleeves, pants, a wide-brimmed hat, and sunglasses whenever you are outside.  Heart disease, diabetes, and high blood pressure  High blood pressure causes heart disease and increases the risk of stroke. High blood pressure is more likely to develop in: ? People who have blood pressure in the high end of the normal range (130-139/85-89 mm Hg). ? People who are overweight or obese. ? People who are African American.  If you are 75-2 years of age, have your blood pressure checked every 3-5 years. If you are 68 years of age or older, have your blood pressure checked every year. You should have your blood pressure measured twice-once when you are at a hospital or clinic, and once when you are not at a hospital or clinic. Record the average of the two measurements. To check  your blood pressure when you are not at a hospital or clinic, you can use: ? An automated blood pressure machine at a pharmacy. ? A home blood pressure monitor.  If you are between 23 years and 69 years old, ask your health care provider if you should take aspirin to prevent strokes.  Have regular diabetes screenings. This involves taking a blood sample to check your fasting blood sugar level. ? If you are at a normal weight and have a low risk for diabetes, have this test once every three years after 30 years of age. ? If you are overweight and have a high risk for diabetes, consider being tested at a younger age or more often. Preventing infection Hepatitis B  If you have a higher risk for hepatitis B, you should be screened for this virus. You are considered at high risk for hepatitis B if: ? You were born in a country where hepatitis B is common. Ask your health care provider which countries are considered high risk. ? Your parents were born in a high-risk country, and you have not been immunized against hepatitis B (hepatitis B vaccine). ? You have HIV or AIDS. ? You use needles to inject street drugs. ? You live with someone who has hepatitis B. ? You have had sex with someone who has hepatitis B. ? You get hemodialysis treatment. ? You take certain medicines for conditions, including cancer, organ transplantation, and autoimmune conditions.  Hepatitis C  Blood testing is recommended for: ? Everyone born from [birth year] through 1963. ? Anyone with known risk factors for hepatitis C.  Sexually transmitted infections (STIs)  You should be screened for sexually transmitted infections (STIs) including gonorrhea and chlamydia if: ? You are sexually active and are younger than 30 years of age. ? You are older than 30 years of age and your health care provider tells you that you are at risk for this type of infection. ? Your sexual activity has changed since you were last screened and you  are at an increased risk for chlamydia or gonorrhea. Ask your health care provider if you are at risk.  If you do not have HIV, but are at risk, it may be recommended that you take a prescription medicine daily to prevent HIV infection. This is called pre-exposure prophylaxis (PrEP). You are considered at risk if: ? You are sexually active and do not regularly use condoms or know  the HIV status of your partner(s). ? You take drugs by injection. ? You are sexually active with a partner who has HIV.  Talk with your health care provider about whether you are at high risk of being infected with HIV. If you choose to begin PrEP, you should first be tested for HIV. You should then be tested every 3 months for as long as you are taking PrEP. Pregnancy  If you are premenopausal and you may become pregnant, ask your health care provider about preconception counseling.  If you may become pregnant, take 400 to 800 micrograms (mcg) of folic acid every day.  If you want to prevent pregnancy, talk to your health care provider about birth control (contraception). Osteoporosis and menopause  Osteoporosis is a disease in which the bones lose minerals and strength with aging. This can result in serious bone fractures. Your risk for osteoporosis can be identified using a bone density scan.  If you are 44 years of age or older, or if you are at risk for osteoporosis and fractures, ask your health care provider if you should be screened.  Ask your health care provider whether you should take a calcium or vitamin D supplement to lower your risk for osteoporosis.  Menopause may have certain physical symptoms and risks.  Hormone replacement therapy may reduce some of these symptoms and risks. Talk to your health care provider about whether hormone replacement therapy is right for you. Follow these instructions at home:  Schedule regular health, dental, and eye exams.  Stay current with your  immunizations.  Do not use any tobacco products including cigarettes, chewing tobacco, or electronic cigarettes.  If you are pregnant, do not drink alcohol.  If you are breastfeeding, limit how much and how often you drink alcohol.  Limit alcohol intake to no more than 1 drink per day for nonpregnant women. One drink equals 12 ounces of beer, 5 ounces of wine, or 1 ounces of hard liquor.  Do not use street drugs.  Do not share needles.  Ask your health care provider for help if you need support or information about quitting drugs.  Tell your health care provider if you often feel depressed.  Tell your health care provider if you have ever been abused or do not feel safe at home. This information is not intended to replace advice given to you by your health care provider. Make sure you discuss any questions you have with your health care provider. Document Released: 06/02/2011 Document Revised: 04/24/2016 Document Reviewed: 08/21/2015 Elsevier Interactive Patient Education  2018 [employer].  IF you received an x-ray today, you will receive an invoice from [facility]. Please contact [facility] at [phone] with questions or concerns regarding your invoice.   IF you received labwork today, you will receive an invoice from [place]. Please contact [facility] at [phone] with questions or concerns regarding your invoice.   Our billing staff will not be able to assist you with questions regarding bills from these companies.  You will be contacted with the lab results as soon as they are available. The fastest way to get your results is to activate your My Chart account. Instructions are located on the last page of this paperwork. If you have not heard from [place] regarding the results in 2 weeks, please contact this office.

## 2018-01-29 NOTE — Progress Notes (Signed)
Primary Care at [place], [place] [address] [phone]  Date:  01/29/2018   Name:  Victoria Larsen   DOB:  Sep 09, 1986   MRN:  [id number]  PCP:  Stephens Shire, MD    Chief Complaint: Annual Exam (w/pap) and Immunizations (Tdap, Hep A, Hep B)   History of Present Illness:  This is a 32 y.o. female with PMH allergies, anxiety, asthma who is presenting for CPE. She has not had an annual exam in 3 years due to no insurance.  She is fasting today.   Complaints: vaginal discharge and odor. She feels like she has BV every other month. She has been treating her symptoms with probiotics and boric acid. No antibiotic treatment as she has not had insurance.  LMP: Feb 10. regular Contraception: none Last pap: pt has h/o abnormal PAP about 4 years ago "it cleared".  Sexual history: she is sexually active with women only. No partner presently.  Immunizations: would like hep A, Hep B and tdap Eye:  20/15 b/l. Wears glasses.  Fam hx: PGF stroke. MGM heart failure Tobacco/alcohol/substance use: former smoker. No drug use. No drinking.    Review of Systems:  Review of Systems  Constitutional: Negative for chills, diaphoresis, fatigue and fever.  HENT: Negative for congestion, postnasal drip, rhinorrhea, sinus pressure, sneezing and sore throat.   Respiratory: Negative for cough, chest tightness, shortness of breath and wheezing.   Cardiovascular: Negative for chest pain and palpitations.  Gastrointestinal: Negative for abdominal pain, diarrhea, nausea and vomiting.  Genitourinary: Positive for vaginal discharge. Negative for decreased urine volume, difficulty urinating, dysuria, enuresis, flank pain, frequency, hematuria, urgency and vaginal pain.  Musculoskeletal: Negative for back pain.  Neurological: Negative for dizziness, weakness, light-headedness and headaches.    Patient Active Problem List   Diagnosis Date Noted  . History of asthma 12/22/2017  . Influenza  with respiratory manifestation 12/22/2017  . Flu-like symptoms 12/22/2017    Prior to Admission medications   Medication Sig Start Date End Date Taking? Authorizing Provider  albuterol (PROVENTIL HFA;VENTOLIN HFA) 108 (90 BASE) MCG/ACT inhaler Inhale 2 puffs into the lungs every 6 (six) hours as needed.   Yes [provider]  albuterol (PROVENTIL) (2.5 MG/3ML) 0.083% nebulizer solution Take 2.5 mg by nebulization every 6 (six) hours as needed.   Yes [provider]  cyclobenzaprine (FLEXERIL) 10 MG tablet Take 10 mg by mouth as needed for muscle spasms.   Yes [provider]  ipratropium-albuterol (DUONEB) 0.5-2.5 (3) MG/3ML SOLN Take 3 mLs by nebulization every 6 (six) hours as needed. 06/13/12  Yes Darlyne Russian, MD  ALPRAZolam Duanne Moron) 0.5 MG tablet Take 0.5 mg by mouth at bedtime as needed.    [provider]  AMBULATORY NON FORMULARY MEDICATION Boric Acid Suppository 600 mg Insert 1 PV twice weekly to maintain vaginal flora 07/21/12   Wardell Honour, MD  mometasone-formoterol [facility]) 100-5 MCG/ACT AERO Inhale 2 puffs into the lungs 2 (two) times daily. Patient not taking: Reported on 12/22/2017 07/25/12   Harrison Mons, PA-C  zolpidem (AMBIEN) 10 MG tablet Take 10 mg by mouth at bedtime as needed.    [provider]    Allergies  Allergen Reactions  . Metronidazole   . Penicillins Rash    infant    Past Surgical History:  Procedure Laterality Date  . OVARIAN CYST SURGERY      Social History   Tobacco Use  . Smoking status: Former Research scientist (life sciences)  .  Smokeless tobacco: Never Used  Substance Use Topics  . Alcohol use: No  . Drug use: No    No family history on file.  Medication list has been reviewed and updated.  Physical Examination:  Physical Exam  Constitutional: She is oriented to person, place, and time. She appears well-developed and well-nourished. No distress.  HENT:  Head: Normocephalic and atraumatic.  Mouth/Throat:  Oropharynx is clear and moist.  Eyes: Conjunctivae and EOM are normal. Pupils are equal, round, and reactive to light.  Cardiovascular: Normal rate, regular rhythm and normal heart sounds.  No murmur heard. Pulmonary/Chest: Effort normal and breath sounds normal. She has no wheezes.  Genitourinary: There is no tenderness on the right labia. There is no tenderness on the left labia. Vaginal discharge (thin, white) found.  Musculoskeletal: Normal range of motion.  Neurological: She is alert and oriented to person, place, and time. She has normal reflexes.  Skin: Skin is warm and dry.  Psychiatric: She has a normal mood and affect. Her behavior is normal. Judgment and thought content normal.  Vitals reviewed.   Pulse 76   Resp 16   Ht 5' 8" (1.727 m)   Wt 161 lb 12.8 oz (73.4 kg)   LMP 01/10/2018   SpO2 98%   BMI 24.60 kg/m   Results for orders placed or performed in visit on 01/29/18  POCT urinalysis dipstick  Result Value Ref Range   Color, UA yellow yellow   Clarity, UA cloudy (A) clear   Glucose, UA negative negative mg/dL   Bilirubin, UA negative negative   Ketones, POC UA negative negative mg/dL   Spec Grav, UA 1.020 1.010 - 1.025   Blood, UA small (A) negative   pH, UA 5.5 5.0 - 8.0   Protein Ur, POC negative negative mg/dL   Urobilinogen, UA 0.2 0.2 or 1.0 E.U./dL   Nitrite, UA Negative Negative   Leukocytes, UA Negative Negative  POCT Wet + KOH Prep  Result Value Ref Range   Yeast by KOH Absent Absent   Yeast by wet prep Absent Absent   WBC by wet prep Many (A) Few   Clue Cells Wet Prep HPF POC Moderate (A) None   Trich by wet prep Absent Absent   Bacteria Wet Prep HPF POC Many (A) Few   Epithelial Cells By [employer] Pref ([facility]) Few None, Few, Too numerous to count   RBC,UR,HPF,POC None None RBC/hpf    Assessment and Plan: 1. Annual physical exam - pt presents for first annual exam in several years. She has h/o abnormal PAP which resolved on it's own (per pt). No  record available.PAP done today. She has h/o BV infections. +clue cells today on wet prep. Plan to treat with clindamycin. Routine labs are pending - will contact with results. Tdap given.  2. Screening, lipid - Lipid panel  3. Screening for endocrine disorder - CMP14+EGFR - POCT urinalysis dipstick  4. Screening for deficiency anemia - CBC with Differential/Platelet  5. Screening for cervical cancer - Pap IG, CT/NG w/ reflex HPV when ASC-U  6. Vaginal discharge 7. BV (bacterial vaginosis) - POCT Wet + KOH Prep - fluconazole (DIFLUCAN) 150 MG tablet; Take 1 tablet (150 mg total) by mouth once for 1 dose. Repeat if needed  Dispense: 2 tablet; Refill: 0 - clindamycin (CLEOCIN) 300 MG capsule; Take 1 capsule (300 mg total) by mouth 2 (two) times daily for 7 days.  Dispense: 14 capsule; Refill: 0  8. Need for diphtheria-tetanus-pertussis (Tdap) vaccine -  Tdap vaccine greater than or equal to 7yo IM   Mercer Pod, PA-C  Primary Care at [place] 01/29/2018 3:52 PM

## 2018-01-30 LAB — CMP14+EGFR
ALT: 14 IU/L (ref 0–32)
AST: 16 IU/L (ref 0–40)
Albumin/Globulin Ratio: 1.7 (ref 1.2–2.2)
Albumin: 4.7 g/dL (ref 3.5–5.5)
Alkaline Phosphatase: 52 IU/L (ref 39–117)
BUN/Creatinine Ratio: 19 (ref 9–23)
BUN: 12 mg/dL (ref 6–20)
Bilirubin Total: 0.4 mg/dL (ref 0.0–1.2)
CO2: 24 mmol/L (ref 20–29)
Calcium: 9.5 mg/dL (ref 8.7–10.2)
Chloride: 103 mmol/L (ref 96–106)
Creatinine, Ser: 0.63 mg/dL (ref 0.57–1.00)
GFR calc Af Amer: 140 mL/min/{1.73_m2} (ref >59)
GFR calc non Af Amer: 122 mL/min/{1.73_m2} (ref >59)
Globulin, Total: 2.7 g/dL (ref 1.5–4.5)
Glucose: 80 mg/dL (ref 65–99)
Potassium: 3.9 mmol/L (ref 3.5–5.2)
Sodium: 139 mmol/L (ref 134–144)
Total Protein: 7.4 g/dL (ref 6.0–8.5)

## 2018-01-30 LAB — CBC WITH DIFFERENTIAL/PLATELET
Basophils Absolute: 0 10*3/uL (ref 0.0–0.2)
Basos: 0 %
EOS (ABSOLUTE): 0.3 10*3/uL (ref 0.0–0.4)
Eos: 3 %
Hematocrit: 38.7 % (ref 34.0–46.6)
Hemoglobin: 12.8 g/dL (ref 11.1–15.9)
Immature Grans (Abs): 0 10*3/uL (ref 0.0–0.1)
Immature Granulocytes: 0 %
Lymphocytes Absolute: 2.9 10*3/uL (ref 0.7–3.1)
Lymphs: 36 %
MCH: 30.1 pg (ref 26.6–33.0)
MCHC: 33.1 g/dL (ref 31.5–35.7)
MCV: 91 fL (ref 79–97)
Monocytes Absolute: 0.5 10*3/uL (ref 0.1–0.9)
Monocytes: 6 %
Neutrophils Absolute: 4.4 10*3/uL (ref 1.4–7.0)
Neutrophils: 55 %
Platelets: 287 10*3/uL (ref 150–379)
RBC: 4.25 x10E6/uL (ref 3.77–5.28)
RDW: 13.8 % (ref 12.3–15.4)
WBC: 8.1 10*3/uL (ref 3.4–10.8)

## 2018-01-30 LAB — LIPID PANEL
Chol/HDL Ratio: 4 ratio (ref 0.0–4.4)
Cholesterol, Total: 169 mg/dL (ref 100–199)
HDL: 42 mg/dL (ref >39)
LDL Calculated: 109 mg/dL — ABNORMAL HIGH (ref 0–99)
Triglycerides: 92 mg/dL (ref 0–149)
VLDL Cholesterol Cal: 18 mg/dL (ref 5–40)

## 2018-02-02 LAB — PAP IG, CT-NG, RFX HPV ASCU
Chlamydia, Nuc. Acid Amp: NEGATIVE
Gonococcus by Nucleic Acid Amp: NEGATIVE
PAP Smear Comment: 0

## 2018-02-03 ENCOUNTER — Encounter: Payer: Self-pay | Admitting: Physician Assistant

## 2018-02-03 NOTE — Progress Notes (Signed)
Labs look great. PAP is negative. Next PAP in 3 years. Result letter sent in mail.

## 2018-02-09 ENCOUNTER — Encounter: Payer: Self-pay | Admitting: Physician Assistant

## 2018-02-16 ENCOUNTER — Other Ambulatory Visit: Payer: Self-pay

## 2018-02-16 ENCOUNTER — Encounter: Payer: Self-pay | Admitting: Physician Assistant

## 2018-02-16 ENCOUNTER — Ambulatory Visit: Payer: BC Managed Care – PPO | Admitting: Physician Assistant

## 2018-02-16 VITALS — BP 100/74 | HR 69 | Temp 98.0°F | Resp 18 | Ht 68.0 in | Wt 162.2 lb

## 2018-02-16 DIAGNOSIS — R8290 Unspecified abnormal findings in urine: Secondary | ICD-10-CM | POA: Diagnosis not present

## 2018-02-16 DIAGNOSIS — N760 Acute vaginitis: Secondary | ICD-10-CM

## 2018-02-16 DIAGNOSIS — N898 Other specified noninflammatory disorders of vagina: Secondary | ICD-10-CM

## 2018-02-16 DIAGNOSIS — Z1321 Encounter for screening for nutritional disorder: Secondary | ICD-10-CM

## 2018-02-16 LAB — POCT URINALYSIS DIP (MANUAL ENTRY)
Bilirubin, UA: NEGATIVE
Glucose, UA: NEGATIVE mg/dL
Ketones, POC UA: NEGATIVE mg/dL
Leukocytes, UA: NEGATIVE
Nitrite, UA: NEGATIVE
Protein Ur, POC: NEGATIVE mg/dL
Spec Grav, UA: >=1.03 — AB (ref 1.010–1.025)
Urobilinogen, UA: 0.2 E.U./dL
pH, UA: 5.5 (ref 5.0–8.0)

## 2018-02-16 LAB — POCT WET + KOH PREP
Trich by wet prep: ABSENT
Yeast by KOH: ABSENT
Yeast by wet prep: ABSENT

## 2018-02-16 LAB — POC MICROSCOPIC URINALYSIS (UMFC): Mucus: ABSENT

## 2018-02-16 MED ORDER — METRONIDAZOLE 0.75 % VA GEL: 1.0000 | Freq: Every day | VAGINAL | 0 refills | Status: AC

## 2018-02-16 NOTE — Progress Notes (Signed)
Victoria Larsen  MRN: [id number] DOB: 1986/04/28  PCP: Sebastian Ache, PA-C  Subjective:  Pt is a 32 year old female who presents to clinic for vaginal discharge and bad smelling urine.  She was here 2 weeks ago for her annual exam and complained of vaginal discharge.  Clue cells in her wet prep.  She was treated for BV with clindamycin.  She is feeling improvement however is still not better.  Wound she has not had any sexual encounters since that time. She has been drinking about 3 cups of water a day.  She has also been sick with a viral illness for the past few weeks.  She admits to not being well hydrated then either. Denies flank pain, back pain, abdominal pain, fever, chills, nausea or vomiting, urinary symptoms  Review of Systems  Constitutional: Negative for chills, fatigue and fever.  Respiratory: Negative for cough, shortness of breath and wheezing.   Gastrointestinal: Negative for abdominal pain, diarrhea, nausea and vomiting.  Genitourinary: Negative for decreased urine volume, difficulty urinating, dysuria, enuresis, flank pain, frequency, hematuria, urgency, vaginal bleeding, vaginal discharge and vaginal pain.  Neurological: Negative for dizziness, weakness, light-headedness and headaches.    Patient Active Problem List   Diagnosis Date Noted  . History of asthma 12/22/2017  . Influenza with respiratory manifestation 12/22/2017  . Flu-like symptoms 12/22/2017    Current Outpatient Medications on File Prior to Visit  Medication Sig Dispense Refill  . albuterol (PROVENTIL HFA;VENTOLIN HFA) 108 (90 BASE) MCG/ACT inhaler Inhale 2 puffs into the lungs every 6 (six) hours as needed.    Marland Kitchen albuterol (PROVENTIL) (2.5 MG/3ML) 0.083% nebulizer solution Take 2.5 mg by nebulization every 6 (six) hours as needed.    . AMBULATORY NON FORMULARY MEDICATION Boric Acid Suppository 600 mg Insert 1 PV twice weekly to maintain vaginal flora 10 suppository 5  .  cyclobenzaprine (FLEXERIL) 10 MG tablet Take 10 mg by mouth as needed for muscle spasms.    Marland Kitchen ipratropium-albuterol (DUONEB) 0.5-2.5 (3) MG/3ML SOLN Take 3 mLs by nebulization every 6 (six) hours as needed. 360 mL 3   No current facility-administered medications on file prior to visit.     Allergies  Allergen Reactions  . Metronidazole   . Penicillins Rash    infant     Objective:  BP 100/74 (BP Location: Left Arm, Patient Position: Sitting, Cuff Size: Normal)   Pulse 69   Temp 98 F (36.7 C) (Oral)   Resp 18   Ht 5\' 8"  (1.727 m)   Wt 162 lb 3.2 oz (73.6 kg)   LMP 02/07/2018   SpO2 99%   BMI 24.66 kg/m   Physical Exam  Constitutional: She is oriented to person, place, and time and well-developed, well-nourished, and in no distress. No distress.  Cardiovascular: Normal rate, regular rhythm and normal heart sounds.  Abdominal: There is no tenderness. There is no CVA tenderness.  Neurological: She is alert and oriented to person, place, and time. GCS score is 15.  Skin: Skin is warm and dry.  Psychiatric: Mood, memory, affect and judgment normal.  Vitals reviewed.   Results for orders placed or performed in visit on 02/16/18  POCT urinalysis dipstick  Result Value Ref Range   Color, UA yellow yellow   Clarity, UA cloudy (A) clear   Glucose, UA negative negative mg/dL   Bilirubin, UA negative negative   Ketones, POC UA negative negative mg/dL   Spec Grav, UA >=7.902 (A) 1.010 - 1.025  Blood, UA moderate (A) negative   pH, UA 5.5 5.0 - 8.0   Protein Ur, POC negative negative mg/dL   Urobilinogen, UA 0.2 0.2 or 1.0 E.U./dL   Nitrite, UA Negative Negative   Leukocytes, UA Negative Negative  POCT Microscopic Urinalysis ([facility])  Result Value Ref Range   WBC,UR,HPF,POC None None WBC/hpf   RBC,UR,HPF,POC Few (A) None RBC/hpf   Bacteria Many (A) None, Too numerous to count   Mucus Absent Absent   Epithelial Cells, UR Per Microscopy Few (A) None, Too numerous to count  cells/hpf  POCT Wet + KOH Prep  Result Value Ref Range   Yeast by KOH Absent Absent   Yeast by wet prep Absent Absent   WBC by wet prep None (A) Few   Clue Cells Wet Prep HPF POC None None   Trich by wet prep Absent Absent   Bacteria Wet Prep HPF POC Many (A) Few   Epithelial Cells By [employer] Pref ([facility]) Many (A) None, Few, Too numerous to count   RBC,UR,HPF,POC None None RBC/hpf    Assessment and Plan :  1. Vaginosis - metroNIDAZOLE (METROGEL VAGINAL) 0.75 % vaginal gel; Place 1 Applicatorful vaginally at bedtime for 5 days.  Dispense: 70 g; Refill: 0 -  Pt presents to clinic for vaginal discharge and bad smelling urine.  She was here 2 weeks ago for her annual exam and complained of vaginal discharge.  Clue cells in her wet prep.  She was treated for BV with clindamycin.  She is feeling improvement however is still not better.  No concerning findings on physical exam today.  Wet prep is negative for yeast or clue cells.  No sign of UTI and urine.  Culture is pending. plan to treat with MetroGel for the next 5 days.  Advised patient to start drinking at least 64 ounces of water daily.  Return to clinic if no improvement in 5-7 days 2. Vaginal discharge - POCT Wet + KOH Prep  3. Abnormal urine odor - POCT urinalysis dipstick - POCT Microscopic Urinalysis ([facility]) - Urine Culture  4. Encounter for vitamin deficiency screening - Vitamin B12    Marco Collie, PA-C  Primary Care at [facility] 02/16/2018 10:07 AM

## 2018-02-16 NOTE — Patient Instructions (Addendum)
Your urine looks like you are a little dehydrated.  Negative UTI, BV or yeast.  Try drinking 60-80 oz of water daily!!!!  Use Metrogel at night before bed for the next 5 days.    Thank you for coming in today. I hope you feel we met your needs.  Feel free to call PCP if you have any questions or further requests.  Please consider signing up for MyChart if you do not already have it, as this is a great way to communicate with me.  Best,  Whitney McVey, PA-C  IF you received an x-ray today, you will receive an invoice from [facility]. Please contact [facility] at [phone] with questions or concerns regarding your invoice.   IF you received labwork today, you will receive an invoice from [place]. Please contact [facility] at [phone] with questions or concerns regarding your invoice.   Our billing staff will not be able to assist you with questions regarding bills from these companies.  You will be contacted with the lab results as soon as they are available. The fastest way to get your results is to activate your My Chart account. Instructions are located on the last page of this paperwork. If you have not heard from [place] regarding the results in 2 weeks, please contact this office.

## 2018-02-17 LAB — URINE CULTURE: Organism ID, Bacteria: NO GROWTH

## 2018-04-13 ENCOUNTER — Encounter: Payer: Self-pay | Admitting: Physician Assistant

## 2018-04-13 ENCOUNTER — Ambulatory Visit: Payer: BC Managed Care – PPO | Admitting: Physician Assistant

## 2018-04-13 ENCOUNTER — Ambulatory Visit (INDEPENDENT_AMBULATORY_CARE_PROVIDER_SITE_OTHER): Payer: BC Managed Care – PPO

## 2018-04-13 ENCOUNTER — Other Ambulatory Visit: Payer: Self-pay

## 2018-04-13 VITALS — BP 100/80 | HR 73 | Temp 98.3°F | Resp 16 | Ht 68.0 in | Wt 161.0 lb

## 2018-04-13 DIAGNOSIS — K5900 Constipation, unspecified: Secondary | ICD-10-CM

## 2018-04-13 DIAGNOSIS — R1031 Right lower quadrant pain: Secondary | ICD-10-CM

## 2018-04-13 DIAGNOSIS — R140 Abdominal distension (gaseous): Secondary | ICD-10-CM | POA: Diagnosis not present

## 2018-04-13 LAB — POCT CBC
Granulocyte percent: 61.3 %G (ref 37–80)
HCT, POC: 40 % (ref 37.7–47.9)
Hemoglobin: 13.3 g/dL (ref 12.2–16.2)
Lymph, poc: 2 (ref 0.6–3.4)
MCH, POC: 30.4 pg (ref 27–31.2)
MCHC: 33.4 g/dL (ref 31.8–35.4)
MCV: 91.1 fL (ref 80–97)
MID (cbc): 0.4 (ref 0–0.9)
MPV: 8.2 fL (ref 0–99.8)
POC Granulocyte: 3.9 (ref 2–6.9)
POC LYMPH PERCENT: 32.1 %L (ref 10–50)
POC MID %: 6.6 %M (ref 0–12)
Platelet Count, POC: 289 10*3/uL (ref 142–424)
RBC: 4.39 M/uL (ref 4.04–5.48)
RDW, POC: 12.8 %
WBC: 6.3 10*3/uL (ref 4.6–10.2)

## 2018-04-13 MED ORDER — POLYETHYLENE GLYCOL 3350 17 GM/SCOOP PO POWD: 17.0000 g | Freq: Two times a day (BID) | ORAL | 1 refills | Status: DC | PRN

## 2018-04-13 NOTE — Progress Notes (Signed)
Victoria Larsen  MRN: [id number] DOB: 02/25/1986  PCP: Sebastian Ache, PA-C  Subjective:  Pt is a pleasant 32 year old female who presents to clinic for abdominal pain.  This is a chronic problem x 10 years. However, 1 week ago intense gas and "really bloated". Sugar makes it worse. She has cut back on red meat, butter.  Constipation x 1 week. BM q 2 days. No loose or watery stools. Stools are hard to pass. Blood in stool - h/o fissures (per pt). Last BM was today.  She has been to GI and had endoscopy, dx with celiac. 2011-2014 "I went through all the things".   Pain RLQ x 9 years. Acute pain x 1 week "feels like someone is grabbing me". Pain is 6-7/10. Pain is worse with sitting.  She is drinking water and eating fiber.  LMP was two weeks ago. H/o ruptured ovarian cyst in 2011.  Denies fever, chills, appetite change, vaginal discharge, abnormal periods,    Review of Systems  Constitutional: Negative for appetite change, chills, fatigue and fever.  Gastrointestinal: Positive for abdominal distention, abdominal pain (RLQ) and constipation. Negative for blood in stool, diarrhea, nausea and vomiting.    Patient Active Problem List   Diagnosis Date Noted  . History of asthma 12/22/2017  . Influenza with respiratory manifestation 12/22/2017  . Flu-like symptoms 12/22/2017    Current Outpatient Medications on File Prior to Visit  Medication Sig Dispense Refill  . albuterol (PROVENTIL HFA;VENTOLIN HFA) 108 (90 BASE) MCG/ACT inhaler Inhale 2 puffs into the lungs every 6 (six) hours as needed.    Marland Kitchen albuterol (PROVENTIL) (2.5 MG/3ML) 0.083% nebulizer solution Take 2.5 mg by nebulization every 6 (six) hours as needed.    . cyclobenzaprine (FLEXERIL) 10 MG tablet Take 10 mg by mouth as needed for muscle spasms.    Marland Kitchen ipratropium-albuterol (DUONEB) 0.5-2.5 (3) MG/3ML SOLN Take 3 mLs by nebulization every 6 (six) hours as needed. 360 mL 3   No current facility-administered  medications on file prior to visit.     Allergies  Allergen Reactions  . Metronidazole   . Penicillins Rash    infant     Objective:  BP 100/80 (BP Location: Left Arm, Patient Position: Sitting, Cuff Size: Normal)   Pulse 73   Temp 98.3 F (36.8 C) (Oral)   Resp 16   Ht 5\' 8"  (1.727 m)   Wt 161 lb (73 kg)   LMP 04/03/2018   SpO2 98%   BMI 24.48 kg/m   Physical Exam  Constitutional: She is oriented to person, place, and time. No distress.  Cardiovascular: Normal rate, regular rhythm and normal heart sounds.  Abdominal: Bowel sounds are normal. There is generalized tenderness (mild). There is no rigidity, no tenderness at McBurney's point and negative Murphy's sign.  Neurological: She is alert and oriented to person, place, and time.  Skin: Skin is warm and dry.  Psychiatric: Judgment normal.  Vitals reviewed.   Results for orders placed or performed in visit on 04/13/18  POCT CBC  Result Value Ref Range   WBC 6.3 4.6 - 10.2 K/uL   Lymph, poc 2.0 0.6 - 3.4   POC LYMPH PERCENT 32.1 10 - 50 %L   MID (cbc) 0.4 0 - 0.9   POC MID % 6.6 0 - 12 %M   POC Granulocyte 3.9 2 - 6.9   Granulocyte percent 61.3 37 - 80 %G   RBC 4.39 4.04 - 5.48 M/uL   Hemoglobin  13.3 12.2 - 16.2 g/dL   HCT, POC 83.2 54.9 - 47.9 %   MCV 91.1 80 - 97 fL   MCH, POC 30.4 27 - 31.2 pg   MCHC 33.4 31.8 - 35.4 g/dL   RDW, POC 82.6 %   Platelet Count, POC 289 142 - 424 K/uL   MPV 8.2 0 - 99.8 fL    Assessment and Plan :  1. Right lower quadrant abdominal pain - DG Abd 2 Views; Future - POCT CBC - pt presents with acute on chronic abdominal pain and bloating. Low suspicion for acute process at this time as her vitals are stable, PE unremarkable and POCT labs are wnl. Plan to assess food allergy - will contact with results and plan. RTC if symptoms worsen in 24-48 hours. Consider imaging.  2. Abdominal bloating 3. Constipation, unspecified constipation type - IgG Allergens(96) Foods - C-reactive  protein - Sedimentation Rate - Celiac Panel   Marco Collie, PA-C  Primary Care at [facility] 04/13/2018 5:58 PM

## 2018-04-13 NOTE — Patient Instructions (Addendum)
  Come back and see me in one week.  We will contact you with the results of today's lab work.   For constipation  Look up "6 Reasons to Care about [place]" at [place] ([email or web address])  1) Water: Make sure you are drinking enough water daily - about 1-3 liters. 2) Fiber: Make sure you are getting enough fiber in your diet - this will make you regular - you can eat high fiber foods or use metamucil as a supplement - it is really important to drink enough water when using fiber supplements. Foods that have a lot of fiber include vegetables, fruits, beans, nuts, oatmeal, and some breads and cereals. You can tell how much fiber is in a food by reading the nutrition label. Doctors recommend eating 25 to 36 grams of fiber each day. 3) Fitness: Increasing your physical activity will help increase the natural movement of your bowels. Try to get 20-30 minutes of exercise daily.  4) Use a 9" stool in front of your toilet to rest your feet on while you have a bowel movement. This will loosen your rectal muscles to help your stool come out easier and prevent straining.   For constipation:   Option 1) For gentle treatment of constipation Use Miralax 1-2 capfuls a day until your stools are soft and regular and then decrease the usage - you can use this daily  Option 2) For more aggressive treatment of constipation Use 4 capfuls of Colace and 6 doses of Miralax and drink it in 2 hours - this should result in several watery stools - if it does not repeat the next day and then go to daily miralax for a week to make sure your bowels are clean and retrained to work properly  Thank you for coming in today. I hope you feel we met your needs.  Feel free to call PCP if you have any questions or further requests.  Please consider signing up for MyChart if you do not already have it, as this is a great way to communicate with me.  Best,  Whitney McVey, PA-C  IF you  received an x-ray today, you will receive an invoice from [facility]. Please contact [facility] at [phone] with questions or concerns regarding your invoice.   IF you received labwork today, you will receive an invoice from [place]. Please contact [facility] at [phone] with questions or concerns regarding your invoice.   Our billing staff will not be able to assist you with questions regarding bills from these companies.  You will be contacted with the lab results as soon as they are available. The fastest way to get your results is to activate your My Chart account. Instructions are located on the last page of this paperwork. If you have not heard from [place] regarding the results in 2 weeks, please contact this office.

## 2018-04-14 ENCOUNTER — Telehealth: Payer: Self-pay | Admitting: Physician Assistant

## 2018-04-14 NOTE — Telephone Encounter (Signed)
Copied from CRM [id number]. Topic: Quick Communication - See Telephone Encounter >> Apr 14, 2018  4:14 PM Trula Slade wrote: CRM for notification. See Telephone encounter for: 04/14/18. Patient want would like for the provider to call and discuss a couple of the procedures they discussed.

## 2018-04-15 ENCOUNTER — Encounter: Payer: Self-pay | Admitting: Physician Assistant

## 2018-04-15 NOTE — Telephone Encounter (Signed)
Sent to McVey  

## 2018-04-16 ENCOUNTER — Encounter: Payer: Self-pay | Admitting: Urgent Care

## 2018-04-16 ENCOUNTER — Ambulatory Visit: Payer: BC Managed Care – PPO | Admitting: Urgent Care

## 2018-04-16 VITALS — BP 100/80 | HR 73 | Temp 98.2°F | Resp 16 | Ht 68.0 in | Wt 165.6 lb

## 2018-04-16 DIAGNOSIS — R1031 Right lower quadrant pain: Secondary | ICD-10-CM | POA: Diagnosis not present

## 2018-04-16 DIAGNOSIS — R102 Pelvic and perineal pain: Secondary | ICD-10-CM

## 2018-04-16 DIAGNOSIS — K5900 Constipation, unspecified: Secondary | ICD-10-CM | POA: Diagnosis not present

## 2018-04-16 DIAGNOSIS — R140 Abdominal distension (gaseous): Secondary | ICD-10-CM

## 2018-04-16 DIAGNOSIS — N83201 Unspecified ovarian cyst, right side: Secondary | ICD-10-CM | POA: Diagnosis not present

## 2018-04-16 LAB — POCT URINE PREGNANCY: Preg Test, Ur: NEGATIVE

## 2018-04-16 MED ORDER — CELECOXIB 100 MG PO CAPS: 100.0000 mg | ORAL_CAPSULE | Freq: Two times a day (BID) | ORAL | 1 refills | Status: DC

## 2018-04-16 MED ORDER — DOCUSATE SODIUM 50 MG PO CAPS: 50.0000 mg | ORAL_CAPSULE | Freq: Two times a day (BID) | ORAL | 0 refills | Status: DC

## 2018-04-16 NOTE — Progress Notes (Signed)
    MRN: [id number] DOB: 18-Feb-1986  Subjective:   Victoria Larsen is a 32 y.o. female presenting for follow up on 10 year history of intermittent right lower sided belly pain. Has been seen by GI, had lab testing and an endoscopy. Had a belly CT which resulted in rupture ovarian cyst. She was also thought to have celiac disease but was lost to follow up in 2013. Reports recurrent right lower sided belly pain 2 weeks ago. Had bloating type sensation, nausea without vomiting, chills. Was having bowel movements every other day. Stools were hard initially. Patient was seen on 04/13/2018 and was advised to use Miralax which has helped soften the stools. She reports having had 2 bowel movements today. Has a family history of diverticulitis in her father, paternal grandmother, paternal uncle. Denies family history of colon cancer. Patient exercises daily. Tries to drink ~80 ounces per day.   Victoria Larsen has a current medication list which includes the following prescription(s): albuterol, albuterol, cyclobenzaprine, ipratropium-albuterol, and polyethylene glycol powder. Also is allergic to metronidazole and penicillins.  Victoria Larsen  has a past medical history of Allergy, Anxiety, Asthma, Fibromyalgia, and Insomnia. Also  has a past surgical history that includes Ovarian cyst surgery.  Objective:   Vitals: BP 100/80   Pulse 73   Temp 98.2 F (36.8 C) (Oral)   Resp 16   Ht 5\' 8"  (1.727 m)   Wt 165 lb 9.6 oz (75.1 kg)   LMP 04/03/2018   SpO2 99%   BMI 25.18 kg/m   Physical Exam  Constitutional: She is oriented to person, place, and time. She appears well-developed and well-nourished.  Eyes: No scleral icterus.  Cardiovascular: Normal rate, regular rhythm and intact distal pulses. Exam reveals no gallop and no friction rub.  No murmur heard. Pulmonary/Chest: No respiratory distress. She has no wheezes. She has no rales.  Abdominal: She exhibits no distension and no mass. There is no hepatosplenomegaly.  There is tenderness in the right lower quadrant and suprapubic area. There is no rigidity, no rebound, no guarding, no CVA tenderness, no tenderness at McBurney's point and negative Murphy's sign.  Neurological: She is alert and oriented to person, place, and time.  Psychiatric:  Flat affect.   Results for orders placed or performed in visit on 04/16/18 (from the past 24 hour(s))  POCT urine pregnancy     Status: None   Collection Time: 04/16/18  4:19 PM  Result Value Ref Range   Preg Test, Ur Negative Negative     Assessment and Plan :   Right lower quadrant abdominal pain - Plan: Ambulatory referral to Gastroenterology, CT ABDOMEN W CONTRAST  Abdominal bloating - Plan: Ambulatory referral to Gastroenterology  Constipation, unspecified constipation type - Plan: Ambulatory referral to Gastroenterology  Cyst of right ovary - Plan: US Pelvis Complete, US Transvaginal Non-OB  Pelvic pain in female - Plan: US Pelvis Complete, US Transvaginal Non-OB, Basic metabolic panel, POCT urine pregnancy  Pursue transvaginal and pelvic ultrasound as well as CT abdomen with contrast.  Patient is frustrated that she does not have an answer yet in the many years she has been having issues with her belly.  I counseled that we should pursue referral back to gastroenterology so they can restart their work-up.  Labs are pending.  ER precautions given.  In the meantime offered patient a prescription for Celebrex.  Wallis Bamberg, PA-C [facility] [phone] 04/16/2018 3:31 PM

## 2018-04-16 NOTE — Patient Instructions (Addendum)
Please make sure that you are eating less or limit your carbohydrates such as white rice, potatoes, white breads, pasta, pizza, sodas, beer, sweet tea, fruit juices. Exercise can also help with your blood sugar. You can instead eat more salads, fruits, vegetables, whole grains, wheat, oats.     Salads - kale, spinach, cabbage, spring mix; use seeds like pumpkin seeds or sunflower seeds; you can also use 1-2 hard boiled eggs. Fruits - avocadoes, berries (blueberries, raspberries, blackberries), apples, oranges, pomegranate, grapefruit Seeds - quinoa, chia seeds; you can also incorporate oatmeal Vegetables - aspargus, cauliflower, broccoli, green beans, brussel spouts, bell peppers; stay away from starchy vegetables like potatoes, carrots, peas  Brussel sprouts - Cut off stems. Place in a mixing bowl that has a lid. Pour in a 1/4-1/2 cup olive oil, spices, use a light amount of parmesan. Place on a baking sheet. Bake for 10 minutes at 400F. Take it out, eat the brussel chips. Place for another 5-10 minutes.   Mashed cauliflower - Boil a bunch of cauliflower in a pot of water. Blend in a food processor with 1-2 tablespoons of butter.  Spaghetti squash -  Cut the squash in half very carefully, clean out seeds from the middle. Place 1/2 face down in a microwave safe dish with at least 2 inches of water. Make 4-6 slits on outside of spaghetti squash and microwave for 10-12 minutes. Take out the spaghetti using a metal spoon. Repeat for the other half.   Vega protein is good protein powder, make sure you use ~6 ice cubes to give it smoothie consistency together with ~4-6 ounces of vanilla soy milk. Throw cinnamon into your shake, use peanut butter. You can also use the fruits that I listed above. Throw spinach or kale into the shake.       Pelvic Pain, Female Pelvic pain is pain in your lower abdomen, below your belly button and between your hips. The pain may start suddenly (acute), keep coming back  (recurring), or last a long time (chronic). Pelvic pain that lasts longer than six months is considered chronic. Pelvic pain may affect your:  Reproductive organs.  Urinary system.  Digestive tract.  Musculoskeletal system.  There are many potential causes of pelvic pain. Sometimes, the pain can be a result of digestive or urinary conditions, strained muscles or ligaments, or even reproductive conditions. Sometimes the cause of pelvic pain is not known. Follow these instructions at home:  Take over-the-counter and prescription medicines only as told by your health care provider.  Rest as told by your health care provider.  Do not have sex it if hurts.  Keep a journal of your pelvic pain. Write down: ? When the pain started. ? Where the pain is located. ? What seems to make the pain better or worse, such as food or your menstrual cycle. ? Any symptoms you have along with the pain.  Keep all follow-up visits as told by your health care provider. This is important. Contact a health care provider if:  Medicine does not help your pain.  Your pain comes back.  You have new symptoms.  You have abnormal vaginal discharge or bleeding, including bleeding after menopause.  You have a fever or chills.  You are constipated.  You have blood in your urine or stool.  You have foul-smelling urine.  You feel weak or lightheaded. Get help right away if:  You have sudden severe pain.  Your pain gets steadily worse.  You have severe pain  along with fever, nausea, vomiting, or excessive sweating.  You lose consciousness. This information is not intended to replace advice given to you by your health care provider. Make sure you discuss any questions you have with your health care provider. Document Released: 10/14/2004 Document Revised: 12/12/2015 Document Reviewed: 09/07/2015 Elsevier Interactive Patient Education  2018 [employer].     IF you received an x-ray today, you  will receive an invoice from [facility]. Please contact [facility] at [phone] with questions or concerns regarding your invoice.   IF you received labwork today, you will receive an invoice from [place]. Please contact [facility] at [phone] with questions or concerns regarding your invoice.   Our billing staff will not be able to assist you with questions regarding bills from these companies.  You will be contacted with the lab results as soon as they are available. The fastest way to get your results is to activate your My Chart account. Instructions are located on the last page of this paperwork. If you have not heard from [place] regarding the results in 2 weeks, please contact this office.

## 2018-04-17 LAB — BASIC METABOLIC PANEL
BUN / CREAT RATIO: 9 (ref 9–23)
BUN: 6 mg/dL (ref 6–20)
CO2: 22 mmol/L (ref 20–29)
CREATININE: 0.64 mg/dL (ref 0.57–1.00)
Calcium: 9.6 mg/dL (ref 8.7–10.2)
Chloride: 102 mmol/L (ref 96–106)
GFR calc Af Amer: 139 mL/min/{1.73_m2} (ref >59)
GFR, EST NON AFRICAN AMERICAN: 121 mL/min/{1.73_m2} (ref >59)
Glucose: 76 mg/dL (ref 65–99)
Potassium: 4.2 mmol/L (ref 3.5–5.2)
SODIUM: 140 mmol/L (ref 134–144)

## 2018-04-17 LAB — IGG ALLERGENS(96) FOODS
C074-IgG Gelatin: <2 ug/mL (ref 0.0–1.9)
Casein IgG: 6.5 ug/mL — ABNORMAL HIGH (ref 0.0–1.9)
Chicken IgG: <2 ug/mL (ref 0.0–1.9)
Chili Pepper IgG: 2.2 ug/mL — ABNORMAL HIGH (ref 0.0–1.9)
Chocolate/Cacao IgG: <2 ug/mL (ref 0.0–1.9)
Coffee IgG: 2.5 ug/mL — ABNORMAL HIGH (ref 0.0–1.9)
Corn IgG: 3.9 ug/mL — ABNORMAL HIGH (ref 0.0–1.9)
F001-IgG Egg White: 6.2 ug/mL — ABNORMAL HIGH (ref 0.0–1.9)
F003-IgG Codfish: <2 ug/mL (ref 0.0–1.9)
F006-IgG Barley, Whole: 12.8 ug/mL — ABNORMAL HIGH (ref 0.0–1.9)
F009-IgG Rice: 6.6 ug/mL — ABNORMAL HIGH (ref 0.0–1.9)
F010-IgG Sesame Seed: 2.8 ug/mL — ABNORMAL HIGH (ref 0.0–1.9)
F012-IgG Green Pea: <2 ug/mL (ref 0.0–1.9)
F015-IgG White Bean: <2 ug/mL (ref 0.0–1.9)
F020-IgG Almond: <2 ug/mL (ref 0.0–1.9)
F023-IgG Crab: 3 ug/mL — ABNORMAL HIGH (ref 0.0–1.9)
F027-IgG Beef: 12 ug/mL — ABNORMAL HIGH (ref 0.0–1.9)
F031-IgG Carrot: 2.1 ug/mL — ABNORMAL HIGH (ref 0.0–1.9)
F033-IgG Orange: <2 ug/mL (ref 0.0–1.9)
F036-IgG Coconut: <2 ug/mL (ref 0.0–1.9)
F040-IgG Tuna: <2 ug/mL (ref 0.0–1.9)
F041-IgG Salmon: <2 ug/mL (ref 0.0–1.9)
F044-IgG Strawberry: 2.6 ug/mL — ABNORMAL HIGH (ref 0.0–1.9)
F047-IgG Garlic: 2.7 ug/mL — ABNORMAL HIGH (ref 0.0–1.9)
F049-IgG Apple: <2 ug/mL (ref 0.0–1.9)
F054-IgG Sweet Potato: <2 ug/mL (ref 0.0–1.9)
F075-IgG Egg (Yolk): 3.4 ug/mL — ABNORMAL HIGH (ref 0.0–1.9)
F076-IgG Alpha Lactalbumin: 3.8 ug/mL — ABNORMAL HIGH (ref 0.0–1.9)
F077-IgG Beta Lactoglobulin: 5.8 ug/mL — ABNORMAL HIGH (ref 0.0–1.9)
F079-IgG Gluten: 13.8 ug/mL — ABNORMAL HIGH (ref 0.0–1.9)
F080-IgG Lobster: <2 ug/mL (ref 0.0–1.9)
F081-IgG Cheese, Cheddar Type: 8.7 ug/mL — ABNORMAL HIGH (ref 0.0–1.9)
F085-IgG Celery: <2 ug/mL (ref 0.0–1.9)
F087-IgG Melon: <2 ug/mL (ref 0.0–1.9)
F089-IgG Mustard: 2.9 ug/mL — ABNORMAL HIGH (ref 0.0–1.9)
F090-IgG Malt: 10.3 ug/mL — ABNORMAL HIGH (ref 0.0–1.9)
F092-IgG Banana: 9.6 ug/mL — ABNORMAL HIGH (ref 0.0–1.9)
F094-IgG Pear: 2.1 ug/mL — ABNORMAL HIGH (ref 0.0–1.9)
F095-IgG Peach: 2.5 ug/mL — ABNORMAL HIGH (ref 0.0–1.9)
F096-IgG Avocado: 2.3 ug/mL — ABNORMAL HIGH (ref 0.0–1.9)
F182-IgG Lima Bean: <2 ug/mL (ref 0.0–1.9)
F202-IgG Cashew Nut: 4.2 ug/mL — ABNORMAL HIGH (ref 0.0–1.9)
F207-IgG Clam: <2 ug/mL (ref 0.0–1.9)
F208-IgG Lemon: 2.9 ug/mL — ABNORMAL HIGH (ref 0.0–1.9)
F209-IgG Grapefruit: <2 ug/mL (ref 0.0–1.9)
F210-IgG Pineapple: 9 ug/mL — ABNORMAL HIGH (ref 0.0–1.9)
F212-IgG Mushroom: 3.6 ug/mL — ABNORMAL HIGH (ref 0.0–1.9)
F214-IgG Spinach: 4 ug/mL — ABNORMAL HIGH (ref 0.0–1.9)
F215-IgG Lettuce: <2 ug/mL (ref 0.0–1.9)
F216-IgG Cabbage: <2 ug/mL (ref 0.0–1.9)
F220-IgG Cinnamon: 3 ug/mL — ABNORMAL HIGH (ref 0.0–1.9)
F222-IgG Tea: 2.1 ug/mL — ABNORMAL HIGH (ref 0.0–1.9)
F225-IgG Pumpkin: <2 ug/mL (ref 0.0–1.9)
F234-IgG Vanilla: 3 ug/mL — ABNORMAL HIGH (ref 0.0–1.9)
F236-IgG Whey: 21.6 ug/mL — ABNORMAL HIGH (ref 0.0–1.9)
F242-IgG Bing Cherry: 4.8 ug/mL — ABNORMAL HIGH (ref 0.0–1.9)
F244-IgG Cucumber: 6.9 ug/mL — ABNORMAL HIGH (ref 0.0–1.9)
F247-IgG Honey: <2 ug/mL (ref 0.0–1.9)
F256-IgG Walnut: <2 ug/mL (ref 0.0–1.9)
F259-IgG Grape: 2.2 ug/mL — ABNORMAL HIGH (ref 0.0–1.9)
F260-IgG Broccoli: <2 ug/mL (ref 0.0–1.9)
F261-IgG Asparagus: 2.3 ug/mL — ABNORMAL HIGH (ref 0.0–1.9)
F262-IgG Eggplant: <2 ug/mL (ref 0.0–1.9)
F263-IgG Green Bell Pepper: <2 ug/mL (ref 0.0–1.9)
F265-IgG Cumin: <2 ug/mL (ref 0.0–1.9)
F269-IgG Basil: <2 ug/mL (ref 0.0–1.9)
F270-IgG Ginger: <2 ug/mL (ref 0.0–1.9)
F278-IgG Bayleaf (Laurel): 2.3 ug/mL — ABNORMAL HIGH (ref 0.0–1.9)
F280-IgG Black Pepper: 8.7 ug/mL — ABNORMAL HIGH (ref 0.0–1.9)
F283-IgG Oregano: 2.4 ug/mL — ABNORMAL HIGH (ref 0.0–1.9)
F284-IgG Turkey: <2 ug/mL (ref 0.0–1.9)
F287-IgG Kidney Bean: <2 ug/mL (ref 0.0–1.9)
F288-IgG Blueberry: 2.6 ug/mL — ABNORMAL HIGH (ref 0.0–1.9)
F291-IgG Cauliflower: <2 ug/mL (ref 0.0–1.9)
F300-IgG Goat's Milk: 16.2 ug/mL — ABNORMAL HIGH (ref 0.0–1.9)
F306-IgG Lime: <2 ug/mL (ref 0.0–1.9)
F319-IgG Red Beet: 3.3 ug/mL — ABNORMAL HIGH (ref 0.0–1.9)
F324-IgG Hop (Food): 3.9 ug/mL — ABNORMAL HIGH (ref 0.0–1.9)
F329-IgG Watermelon: <2 ug/mL (ref 0.0–1.9)
F338-IgG Scallop: 2.3 ug/mL — ABNORMAL HIGH (ref 0.0–1.9)
F341-IgG Cranberry: <2 ug/mL (ref 0.0–1.9)
F342-IgG Olive, Black: <2 ug/mL (ref 0.0–1.9)
F343-IgG Raspberry: <2 ug/mL (ref 0.0–1.9)
Green Bean IgG: 6.9 ug/mL — ABNORMAL HIGH (ref 0.0–1.9)
Lamb IgG: 2.2 ug/mL — ABNORMAL HIGH (ref 0.0–1.9)
Oat IgG: 5 ug/mL — ABNORMAL HIGH (ref 0.0–1.9)
Onion IgG: <2 ug/mL (ref 0.0–1.9)
Peanut IgG: <2 ug/mL (ref 0.0–1.9)
Pork IgG: 3.3 ug/mL — ABNORMAL HIGH (ref 0.0–1.9)
Potato, White, IgG: <2 ug/mL (ref 0.0–1.9)
Rye IgG: 12 ug/mL — ABNORMAL HIGH (ref 0.0–1.9)
Shrimp IgG: <2 ug/mL (ref 0.0–1.9)
Soybean IgG: <2 ug/mL (ref 0.0–1.9)
Tomato IgG: <2 ug/mL (ref 0.0–1.9)
Wheat IgG: 19.5 ug/mL — ABNORMAL HIGH (ref 0.0–1.9)
Yeast IgG: <2 ug/mL (ref 0.0–1.9)

## 2018-04-17 LAB — C-REACTIVE PROTEIN: CRP: <0.3 mg/L (ref 0.0–4.9)

## 2018-04-17 LAB — GLIA (IGA/G) + TTG IGA
Antigliadin Abs, IgA: 14 U (ref 0–19)
Gliadin IgG: 3 U (ref 0–19)
Transglutaminase IgA: <2 U/mL (ref 0–3)

## 2018-04-17 LAB — SEDIMENTATION RATE: Sed Rate: 2 mm/h (ref 0–32)

## 2018-04-19 ENCOUNTER — Telehealth: Payer: Self-pay | Admitting: Urgent Care

## 2018-04-19 ENCOUNTER — Encounter: Payer: Self-pay | Admitting: Urgent Care

## 2018-04-19 ENCOUNTER — Telehealth: Payer: Self-pay | Admitting: Physician Assistant

## 2018-04-19 NOTE — Telephone Encounter (Signed)
Will if it is more reasonable to just get the CT abdomen pelvis then yes we should change it and just cancel the ultrasound.  I initially had ordered the ultrasound because I do suspect that her symptoms may be more gynecologic given her history of right-sided ovarian cysts.  Thank you for your help Pattricia Boss!

## 2018-04-19 NOTE — Telephone Encounter (Signed)
Copied from CRM [id number]. Topic: Quick Communication - See Telephone Encounter >> Apr 19, 2018  3:54 PM Eston Mould B wrote: CRM for notification. See Telephone encounter for: 04/19/18.  PT has questions about her lab work  she received the results on [place]   [phone]

## 2018-04-19 NOTE — Telephone Encounter (Signed)
CT Abdomen W Contrast is under review with [employer]. Spoke with [facility] nurse reviewer to provide additional clinicals. Case is to close on 04/21/18 but the nurse said a CT of Abdomen will not be approved for lower quadrant pain. She wanted to know if this is something gynecological, would provider like to change to CT Abdomen and Pelvis? Please advise. Thanks!

## 2018-04-20 NOTE — Telephone Encounter (Signed)
Would you like to proceed with ultrasounds instead of placing another CT order?

## 2018-04-22 NOTE — Telephone Encounter (Signed)
Ultrasounds and GI referral sent. Thanks!

## 2018-04-22 NOTE — Telephone Encounter (Signed)
Yes, let's pursue the ultrasounds.  I will defer to a GI doctor for further work-up and let them decide whether or not they need to obtain a belly CT.  I do think I put in the referral to GI but please let me know if this is not the case.  Thank you for your help, Pattricia Boss!

## 2018-04-28 ENCOUNTER — Encounter: Payer: Self-pay | Admitting: Physician Assistant

## 2018-04-28 NOTE — Progress Notes (Signed)
Results released to Mychart. Advised elimination diet: wheat and whey

## 2018-05-04 ENCOUNTER — Other Ambulatory Visit: Payer: Self-pay | Admitting: Gastroenterology

## 2018-05-04 DIAGNOSIS — R112 Nausea with vomiting, unspecified: Secondary | ICD-10-CM

## 2018-05-05 ENCOUNTER — Ambulatory Visit
Admission: RE | Admit: 2018-05-05 | Discharge: 2018-05-05 | Disposition: A | Discharge status: Home / Self Care | Payer: BC Managed Care – PPO | Source: Ambulatory Visit | Attending: Gastroenterology | Admitting: Gastroenterology

## 2018-05-05 DIAGNOSIS — R112 Nausea with vomiting, unspecified: Secondary | ICD-10-CM

## 2018-05-05 MED ORDER — IOPAMIDOL (ISOVUE-300) INJECTION 61%
100.0000 mL | Freq: Once | INTRAVENOUS | Status: AC | PRN
  Administered 2018-05-05: 100 mL via INTRAVENOUS

## 2018-05-06 ENCOUNTER — Ambulatory Visit
Admission: RE | Admit: 2018-05-06 | Discharge: 2018-05-06 | Disposition: A | Discharge status: Home / Self Care | Payer: BC Managed Care – PPO | Source: Ambulatory Visit | Attending: Urgent Care | Admitting: Urgent Care

## 2018-05-06 DIAGNOSIS — N83201 Unspecified ovarian cyst, right side: Secondary | ICD-10-CM

## 2018-05-06 DIAGNOSIS — R102 Pelvic and perineal pain: Secondary | ICD-10-CM

## 2018-05-07 ENCOUNTER — Telehealth: Payer: Self-pay | Admitting: Physician Assistant

## 2018-05-07 NOTE — Telephone Encounter (Signed)
Copied from CRM [id number]. Topic: Quick Communication - See Telephone Encounter >> May 07, 2018  3:44 PM Lorrine Kin, NT wrote: CRM for notification. See Telephone encounter for: 05/07/18. Patient calling to inquire about the Ultrasound and CT results from yesterday (^/6/19). Would like a call back to discuss. Please advise. CB#: [phone]

## 2018-05-10 NOTE — Telephone Encounter (Signed)
Relation to pt: self Call back number: [phone]    Reason for call:  Patient was seen by Wallis Bamberg on 04/16/18 patient inquiring about U/S results taken 05/06/18 please advise

## 2018-05-11 NOTE — Telephone Encounter (Signed)
Please on [place].

## 2018-05-11 NOTE — Telephone Encounter (Signed)
Please let patient know that she has fairly normal ultrasound.  All that it showed was that she had a dominant follicle in the right ovary.  This is a fluid-filled sac that typically happens along the life menstrual cycle.  It is within the normal limits of what we are expect for a follicle.  At this point I think it is important that we continue to pursue the referral to gastroenterology to make sure that everything is squared away and that it.  If patient continues to have pain please have her schedule an appointment for recheck.  Otherwise she can continue to use Celebrex if this is helping.

## 2018-05-14 ENCOUNTER — Encounter: Payer: Self-pay | Admitting: Urgent Care

## 2018-05-14 DIAGNOSIS — R102 Pelvic and perineal pain: Secondary | ICD-10-CM

## 2018-05-14 DIAGNOSIS — N83201 Unspecified ovarian cyst, right side: Secondary | ICD-10-CM

## 2018-05-14 DIAGNOSIS — R140 Abdominal distension (gaseous): Secondary | ICD-10-CM

## 2018-06-08 ENCOUNTER — Encounter: Payer: Self-pay | Admitting: Physician Assistant

## 2018-06-30 ENCOUNTER — Other Ambulatory Visit: Payer: Self-pay

## 2018-06-30 ENCOUNTER — Ambulatory Visit: Payer: BC Managed Care – PPO | Admitting: Physician Assistant

## 2018-06-30 ENCOUNTER — Encounter: Payer: Self-pay | Admitting: Physician Assistant

## 2018-06-30 VITALS — BP 104/71 | HR 75 | Temp 98.4°F | Resp 16 | Ht 69.0 in | Wt 160.0 lb

## 2018-06-30 DIAGNOSIS — N898 Other specified noninflammatory disorders of vagina: Secondary | ICD-10-CM

## 2018-06-30 LAB — POCT WET + KOH PREP
Trich by wet prep: ABSENT
Yeast by KOH: ABSENT
Yeast by wet prep: ABSENT

## 2018-06-30 NOTE — Progress Notes (Signed)
Victoria Larsen  MRN: [id number] DOB: Aug 13, 1986  PCP: Sebastian Ache, PA-C  Subjective:  Pt is a 32 year old female who presents to clinic for possible STI.  She endorses a small bump on her labia majora. She had a biopsy of a similar lesion several years ago and was told it was psoriasis (per pt). New lesion is on opposite side. Denies pain, drainage, vaginal discharge, fever, chills.  Also endorses some vaginal odor.  She has a new sexual partner. Intercourse with women only.   Review of Systems  Constitutional: Negative for chills and fever.  Genitourinary: Positive for genital sores. Negative for difficulty urinating, dyspareunia, dysuria, frequency, menstrual problem, pelvic pain, urgency, vaginal bleeding, vaginal discharge and vaginal pain.  Skin: Negative for rash.    Patient Active Problem List   Diagnosis Date Noted  . History of asthma 12/22/2017  . Influenza with respiratory manifestation 12/22/2017  . Flu-like symptoms 12/22/2017    Current Outpatient Medications on File Prior to Visit  Medication Sig Dispense Refill  . albuterol (PROVENTIL HFA;VENTOLIN HFA) 108 (90 BASE) MCG/ACT inhaler Inhale 2 puffs into the lungs every 6 (six) hours as needed.    Marland Kitchen albuterol (PROVENTIL) (2.5 MG/3ML) 0.083% nebulizer solution Take 2.5 mg by nebulization every 6 (six) hours as needed.    . cyclobenzaprine (FLEXERIL) 10 MG tablet Take 10 mg by mouth as needed for muscle spasms.    Marland Kitchen ipratropium-albuterol (DUONEB) 0.5-2.5 (3) MG/3ML SOLN Take 3 mLs by nebulization every 6 (six) hours as needed. 360 mL 3  . Norethin-Eth Estrad-Fe Biphas (LO LOESTRIN FE PO) Take by mouth.    Marland Kitchen omeprazole (PRILOSEC) 10 MG capsule Take 10 mg by mouth daily.    Marland Kitchen PEPPERMINT OIL PO Take by mouth.    . celecoxib (CELEBREX) 100 MG capsule Take 1 capsule (100 mg total) by mouth 2 (two) times daily. (Patient not taking: Reported on 06/30/2018) 30 capsule 1  . docusate sodium (COLACE) 50 MG  capsule Take 1 capsule (50 mg total) by mouth 2 (two) times daily. (Patient not taking: Reported on 06/30/2018) 60 capsule 0   No current facility-administered medications on file prior to visit.     Allergies  Allergen Reactions  . Metronidazole   . Penicillins Rash    infant     Objective:  BP 104/71 (BP Location: Right Arm, Patient Position: Sitting, Cuff Size: Normal)   Pulse 75   Temp 98.4 F (36.9 C) (Oral)   Resp 16   Ht 5\' 9"  (1.753 m)   Wt 160 lb (72.6 kg)   LMP 06/18/2018   SpO2 96%   BMI 23.63 kg/m   Physical Exam  Constitutional: She is oriented to person, place, and time. No distress.  Genitourinary:    There is lesion on the left labia. Cervix exhibits no discharge. Vaginal discharge (thin, white) found.  Neurological: She is alert and oriented to person, place, and time.  Skin: Skin is warm and dry.  Psychiatric: Judgment normal.  Vitals reviewed.   Results for orders placed or performed in visit on 06/30/18  POCT Wet + KOH Prep  Result Value Ref Range   Yeast by KOH Absent Absent   Yeast by wet prep Absent Absent   WBC by wet prep None (A) Few   Clue Cells Wet Prep HPF POC None None   Trich by wet prep Absent Absent   Bacteria Wet Prep HPF POC Many (A) Few   Epithelial Cells By [employer]  Pref ([facility]) Moderate (A) None, Few, Too numerous to count   RBC,UR,HPF,POC None None RBC/hpf    Assessment and Plan :  1. Vaginal odor - negative wet prep. RTC in 6 months for annual exam.  - POCT Wet + KOH Prep  2. Vaginal lesion - Lesion is not suspicious for HPV or HSV - culture taken to be sure. Will contact with results.  - Herpes simplex virus culture   Marco Collie, PA-C  Primary Care at [facility] 06/30/2018 5:03 PM  Please note: Portions of this report may have been transcribed using dragon voice recognition software. Every effort was made to ensure accuracy; however, inadvertent computerized transcription errors may be  present.

## 2018-06-30 NOTE — Patient Instructions (Addendum)
  You're all good.  Come back in March for your annual exam!   IF you received an x-ray today, you will receive an invoice from [facility]. Please contact [facility] at [phone] with questions or concerns regarding your invoice.   IF you received labwork today, you will receive an invoice from [place]. Please contact [facility] at [phone] with questions or concerns regarding your invoice.   Our billing staff will not be able to assist you with questions regarding bills from these companies.  You will be contacted with the lab results as soon as they are available. The fastest way to get your results is to activate your My Chart account. Instructions are located on the last page of this paperwork. If you have not heard from [place] regarding the results in 2 weeks, please contact this office.

## 2018-07-02 LAB — HERPES SIMPLEX VIRUS CULTURE

## 2018-07-15 ENCOUNTER — Other Ambulatory Visit: Payer: Self-pay

## 2018-07-15 ENCOUNTER — Ambulatory Visit: Payer: BC Managed Care – PPO | Admitting: Physician Assistant

## 2018-07-15 VITALS — BP 115/71 | HR 75 | Temp 98.0°F | Resp 16 | Ht 69.0 in | Wt 162.0 lb

## 2018-07-15 DIAGNOSIS — R109 Unspecified abdominal pain: Secondary | ICD-10-CM

## 2018-07-15 DIAGNOSIS — G8929 Other chronic pain: Secondary | ICD-10-CM

## 2018-07-15 MED ORDER — DICYCLOMINE HCL 20 MG PO TABS: 20.0000 mg | ORAL_TABLET | Freq: Four times a day (QID) | ORAL | 1 refills | Status: DC

## 2018-07-15 NOTE — Patient Instructions (Signed)
° ° ° °  If you have lab work done today you will be contacted with your lab results within the next 2 weeks.  If you have not heard from us then please contact us. The fastest way to get your results is to register for My Chart. ° ° °IF you received an x-ray today, you will receive an invoice from [facility]. Please contact [facility] at [phone] with questions or concerns regarding your invoice.  ° °IF you received labwork today, you will receive an invoice from [facility]. Please contact [facility] at [phone] with questions or concerns regarding your invoice.  ° °Our billing staff will not be able to assist you with questions regarding bills from these companies. ° °You will be contacted with the lab results as soon as they are available. The fastest way to get your results is to activate your My Chart account. Instructions are located on the last page of this paperwork. If you have not heard from us regarding the results in 2 weeks, please contact this office. °  ° ° ° °

## 2018-07-15 NOTE — Progress Notes (Signed)
07/16/2018 8:35 AM   DOB: 1986-03-22 / MRN: [id number]  SUBJECTIVE:  Victoria Larsen is a 32 y.o. female presenting for chronic abdominal pain.  It is typically right sided.  She has a known ovarian cyst about the right ovary. Symptoms present for 10 years. Associates bright red blood in her stools.  No constipation or diarrhea.  The problem is waxing and waning but generally worse. She has tried seeing Dr. Loreta Ave in GI.   She is allergic to metronidazole and penicillins.   She  has a past medical history of Allergy, Anxiety, Asthma, Fibromyalgia, and Insomnia.    She  reports that she has quit smoking. She has never used smokeless tobacco. She reports that she does not drink alcohol or use drugs. She  reports that she currently engages in sexual activity. The patient  has a past surgical history that includes Ovarian cyst surgery.  Her family history includes Heart disease in her maternal grandmother; Stroke in her paternal grandfather.  Review of Systems  Constitutional: Negative for diaphoresis.  Eyes: Negative.   Respiratory: Negative for cough, hemoptysis, sputum production, shortness of breath and wheezing.   Cardiovascular: Negative for chest pain, orthopnea and leg swelling.  Gastrointestinal: Positive for abdominal pain and blood in stool. Negative for constipation, diarrhea, heartburn, melena, nausea and vomiting.  Genitourinary: Negative for flank pain.  Neurological: Negative for dizziness, sensory change, speech change, focal weakness and headaches.    The problem list and medications were reviewed and updated by myself where necessary and exist elsewhere in the encounter.   OBJECTIVE:  BP 115/71   Pulse 75   Temp 98 F (36.7 C) (Oral)   Resp 16   Ht 5\' 9"  (1.753 m)   Wt 162 lb (73.5 kg)   LMP 06/17/2018   SpO2 97%   BMI 23.92 kg/m   Wt Readings from Last 3 Encounters:  07/15/18 162 lb (73.5 kg)  06/30/18 160 lb (72.6 kg)  04/16/18 165 lb 9.6 oz (75.1 kg)     Temp Readings from Last 3 Encounters:  07/15/18 98 F (36.7 C) (Oral)  06/30/18 98.4 F (36.9 C) (Oral)  04/16/18 98.2 F (36.8 C) (Oral)   BP Readings from Last 3 Encounters:  07/15/18 115/71  06/30/18 104/71  04/16/18 100/80   Pulse Readings from Last 3 Encounters:  07/15/18 75  06/30/18 75  04/16/18 73    Physical Exam  Constitutional: She is oriented to person, place, and time. She appears well-nourished. No distress.  Eyes: Pupils are equal, round, and reactive to light. EOM are normal.  Cardiovascular: Normal rate, regular rhythm, S1 normal, S2 normal, normal heart sounds and intact distal pulses. Exam reveals no gallop, no friction rub and no decreased pulses.  No murmur heard. Pulmonary/Chest: Effort normal. No stridor. No respiratory distress. She has no wheezes. She has no rales.  Abdominal: She exhibits no distension and no mass. There is tenderness (right sided, lower quadrant and into the pelvis.  No CVA ttp. ). There is no rebound and no guarding. No hernia.  Musculoskeletal: She exhibits no edema.  Neurological: She is alert and oriented to person, place, and time. No cranial nerve deficit. Gait normal.  Skin: Skin is dry. She is not diaphoretic.  Psychiatric: She has a normal mood and affect.  Vitals reviewed.   Lab Results  Component Value Date   WBC 6.3 04/13/2018   HGB 13.3 04/13/2018   HCT 40.0 04/13/2018   MCV 91.1 04/13/2018  PLT 287 01/29/2018    Lab Results  Component Value Date   CREATININE 0.64 04/16/2018   BUN 6 04/16/2018   NA 140 04/16/2018   K 4.2 04/16/2018   CL 102 04/16/2018   CO2 22 04/16/2018    Lab Results  Component Value Date   ALT 12 07/15/2018   AST 13 07/15/2018   ALKPHOS 47 07/15/2018   BILITOT 0.3 07/15/2018    No results found for: TSH  Lab Results  Component Value Date   CHOL 169 01/29/2018   HDL 42 01/29/2018   LDLCALC 109 (H) 01/29/2018   TRIG 92 01/29/2018   CHOLHDL 4.0 01/29/2018      ASSESSMENT AND PLAN:  Donabelle was seen today for flank pain, blood in stools and lymphadenopathy.  Diagnoses and all orders for this visit:  Chronic abdominal pain: She has been screened for celiac and negative.  IBD and IBS have not been ruled in or out yet.  She does not want to pay for the calprotectin test.  Will try bentyl.  She will return to Dr. Loreta Ave in GI and  -     Hepatic function panel -     Calprotectin, Fecal -     Urinalysis, dipstick only  Other orders -     dicyclomine (BENTYL) 20 MG tablet; Take 1 tablet (20 mg total) by mouth every 6 (six) hours.    The patient is advised to call or return to clinic if she does not see an improvement in symptoms, or to seek the care of the closest emergency department if she worsens with the above plan.   Deliah Boston, MHS, PA-C Primary Care at [facility] 07/16/2018 8:35 AM

## 2018-07-16 LAB — HEPATIC FUNCTION PANEL
ALBUMIN: 4.8 g/dL (ref 3.5–5.5)
ALT: 12 IU/L (ref 0–32)
AST: 13 IU/L (ref 0–40)
Alkaline Phosphatase: 47 IU/L (ref 39–117)
BILIRUBIN TOTAL: 0.3 mg/dL (ref 0.0–1.2)
Bilirubin, Direct: 0.09 mg/dL (ref 0.00–0.40)
TOTAL PROTEIN: 6.8 g/dL (ref 6.0–8.5)

## 2018-07-16 LAB — URINALYSIS, DIPSTICK ONLY
Bilirubin, UA: NEGATIVE
GLUCOSE, UA: NEGATIVE
Ketones, UA: NEGATIVE
Leukocytes, UA: NEGATIVE
Nitrite, UA: NEGATIVE
PH UA: 7.5 (ref 5.0–7.5)
Protein, UA: NEGATIVE
Specific Gravity, UA: 1.006 (ref 1.005–1.030)
Urobilinogen, Ur: 0.2 mg/dL (ref 0.2–1.0)

## 2018-07-16 LAB — POC MICROSCOPIC URINALYSIS (UMFC): MUCUS RE: ABSENT

## 2018-07-16 NOTE — Addendum Note (Signed)
Addended by: Ofilia Neas on: 07/16/2018 02:45 PM   Modules accepted: Orders

## 2018-07-30 LAB — HM COLONOSCOPY

## 2018-08-24 ENCOUNTER — Other Ambulatory Visit: Payer: Self-pay | Admitting: Obstetrics & Gynecology

## 2018-08-27 NOTE — Patient Instructions (Addendum)
Your procedure is scheduled on:  Wednesday, Oct 9  Enter through the [employer] [facility] at: 7 am  Pick up the phone at the desk and dial [phone].  Call this number if you have problems the morning of surgery: [phone].  Remember: Do NOT eat food or Do NOT drink clear liquids (including water) after midnight Tuesday.  Take these medicines the morning of surgery with a SIP OF WATER:  Prilosec, Lo Loestrin  Brush your teeth on the day of surgery.  Bring albuterol inhaler with you on day of surgery.  Stop herbal medications, vitamin supplements, Ibuprofen/NSAIDS at this time.  Do NOT wear jewelry (body piercing), metal hair clips/bobby pins, make-up, or nail polish. Do NOT wear lotions, powders, or perfumes.  You may wear deoderant. Do NOT shave for 48 hours prior to surgery. Do NOT bring valuables to the hospital.  Have a responsible adult drive you home and stay with you for 24 hours after your procedure.  Home with Mother Victoria Larsen cell [phone] or Sister Victoria Larsen cell [phone].

## 2018-08-31 ENCOUNTER — Ambulatory Visit: Payer: BC Managed Care – PPO | Admitting: Family Medicine

## 2018-08-31 ENCOUNTER — Other Ambulatory Visit: Payer: Self-pay

## 2018-08-31 ENCOUNTER — Encounter: Payer: Self-pay | Admitting: Family Medicine

## 2018-08-31 VITALS — BP 96/58 | HR 76 | Temp 98.1°F | Ht 69.0 in | Wt 162.2 lb

## 2018-08-31 DIAGNOSIS — J22 Unspecified acute lower respiratory infection: Secondary | ICD-10-CM | POA: Diagnosis not present

## 2018-08-31 DIAGNOSIS — J029 Acute pharyngitis, unspecified: Secondary | ICD-10-CM | POA: Diagnosis not present

## 2018-08-31 DIAGNOSIS — R05 Cough: Secondary | ICD-10-CM | POA: Diagnosis not present

## 2018-08-31 DIAGNOSIS — R059 Cough, unspecified: Secondary | ICD-10-CM

## 2018-08-31 LAB — POCT RAPID STREP A (OFFICE): RAPID STREP A SCREEN: NEGATIVE

## 2018-08-31 MED ORDER — AZITHROMYCIN 250 MG PO TABS: ORAL_TABLET | ORAL | 0 refills | Status: DC

## 2018-08-31 NOTE — Progress Notes (Signed)
Subjective:  By signing my name below, I, Essence Howell, attest that this documentation has been prepared under the direction and in the presence of Shade Flood, MD Electronically Signed: Charline Larsen, ED Scribe 08/31/2018 at 12:37 PM.   Patient ID: Victoria Larsen, female    DOB: [birth year], 32 y.o.   MRN: [id number]  Chief Complaint  Patient presents with  . Cough    mucus. Sunday 22nd shifted, but started 08/19/2018  . Sore Throat   HPI Victoria APODACA "Victoria Larsen" is a 32 y.o. female, with a h/o asthma, who presents to Primary Care at [facility] complaining of productive cough and sore throat x 12 days, worsened over the past 9 days. Pt states that symptoms began as fever with Tmax of 101 F, body aches, sinus congestion 2 wks ago. She was treated at urgent care on 9/23 for asthma flare, prescribed Prednisone x 5 days. Now she is having productive cough with discolored mucus, rhinorrhea, nasal congestion. States she has been using her inhaler once/daily preventatively, was using it once/yr prior to illness. She has also tried Elderberry without sig improvement. Denies recent fever, cold symptoms. Reports that she is having surgery on 10/9 and wants to make sure her illness has completely resolved by then.  Patient Active Problem List   Diagnosis Date Noted  . History of asthma 12/22/2017  . Influenza with respiratory manifestation 12/22/2017  . Flu-like symptoms 12/22/2017   Past Medical History:  Diagnosis Date  . Allergy   . Anxiety   . Asthma   . Fibromyalgia   . Insomnia    Past Surgical History:  Procedure Laterality Date  . OVARIAN CYST SURGERY     Allergies  Allergen Reactions  . Metronidazole   . Penicillins Rash    Infant Has patient had a PCN reaction causing immediate rash, facial/tongue/throat swelling, SOB or lightheadedness with hypotension: yes Has patient had a PCN reaction causing severe rash involving mucus membranes or skin necrosis: no Has patient  had a PCN reaction that required hospitalization: no Has patient had a PCN reaction occurring within the last 10 years: no If all of the above answers are "NO", then may proceed with Cephalosporin use.    Prior to Admission medications   Medication Sig Start Date End Date Taking? Authorizing Provider  albuterol (PROVENTIL HFA;VENTOLIN HFA) 108 (90 BASE) MCG/ACT inhaler Inhale 2 puffs into the lungs every 6 (six) hours as needed for shortness of breath.     [provider]  albuterol (PROVENTIL) (2.5 MG/3ML) 0.083% nebulizer solution Take 2.5 mg by nebulization every 6 (six) hours as needed for shortness of breath.     [provider]  ASHWAGANDHA PO Take 1 capsule by mouth daily.    [provider]  cyclobenzaprine (FLEXERIL) 10 MG tablet Take 10 mg by mouth 3 (three) times daily as needed for muscle spasms.     [provider]  ipratropium-albuterol (DUONEB) 0.5-2.5 (3) MG/3ML SOLN Take 3 mLs by nebulization every 6 (six) hours as needed. 06/13/12   Collene Gobble, MD  Norethin-Eth Estrad-Fe Biphas (LO LOESTRIN FE PO) Take 1 tablet by mouth daily.     [provider]  omeprazole (PRILOSEC) 40 MG capsule Take 40 mg by mouth daily.     [provider]  OVER THE COUNTER MEDICATION Take 1 capsule by mouth daily as needed (FDGARD).    [provider]  OVER THE COUNTER MEDICATION Take 1 capsule by mouth daily. [place]  [provider]  OVER THE COUNTER MEDICATION Take 1 capsule by mouth daily. 5-Hydroxytryptophan (5-HTP)    [provider]  Peppermint Oil (IBGARD) 90 MG CPCR Take 1 capsule by mouth daily as needed (IBS).    [provider]   Social History   Socioeconomic History  . Marital status: Single    Spouse name: Not on file  . Number of children: Not on file  . Years of education: Not on file  . Highest education level: Not on file  Occupational History  . Not on file  Social Needs  .  Financial resource strain: Not on file  . Food insecurity:    Worry: Not on file    Inability: Not on file  . Transportation needs:    Medical: Not on file    Non-medical: Not on file  Tobacco Use  . Smoking status: Former Games developer  . Smokeless tobacco: Never Used  Substance and Sexual Activity  . Alcohol use: No  . Drug use: No  . Sexual activity: Yes    Comment: Same sex partner  Lifestyle  . Physical activity:    Days per week: Not on file    Minutes per session: Not on file  . Stress: Not on file  Relationships  . Social connections:    Talks on phone: Not on file    Gets together: Not on file    Attends religious service: Not on file    Active member of club or organization: Not on file    Attends meetings of clubs or organizations: Not on file    Relationship status: Not on file  . Intimate partner violence:    Fear of current or ex partner: Not on file    Emotionally abused: Not on file    Physically abused: Not on file    Forced sexual activity: Not on file  Other Topics Concern  . Not on file  Social History Narrative   Marital status: single; dating same sexual partner casually.  Children: none.  Employment: Landscaping.  Tobacco: none  Alcohol: none Drugs: none.  Sexually active: yes.   Review of Systems  Constitutional: Negative for fever (resolved).  HENT: Positive for congestion, rhinorrhea, sinus pressure (mild) and sore throat.   Respiratory: Positive for cough. Negative for wheezing (improved).   Musculoskeletal: Negative for myalgias (resolved).      Objective:   Physical Exam  Constitutional: She is oriented to person, place, and time. She appears well-developed and well-nourished. No distress.  HENT:  Head: Normocephalic and atraumatic.  Right Ear: Hearing, tympanic membrane, external ear and ear canal normal.  Left Ear: Hearing, tympanic membrane, external ear and ear canal normal.  Nose: Right sinus exhibits maxillary sinus tenderness (minimal).  Left sinus exhibits maxillary sinus tenderness (minimal).  Mouth/Throat: Oropharynx is clear and moist. No oropharyngeal exudate or posterior oropharyngeal erythema.  Eyes: Pupils are equal, round, and reactive to light. Conjunctivae and EOM are normal.  Cardiovascular: Normal rate, regular rhythm, normal heart sounds and intact distal pulses.  No murmur heard. Pulmonary/Chest: Effort normal and breath sounds normal. No respiratory distress. She has no wheezes. She has no rhonchi.  Lymphadenopathy:    She has no cervical adenopathy.  Neurological: She is alert and oriented to person, place, and time.  Skin: Skin is warm and dry. No rash noted.  Psychiatric: She has a normal mood and affect. Her behavior is normal.  Vitals reviewed.  Vitals:   08/31/18 1218  BP: Marland Kitchen)  96/58  Pulse: 76  Temp: 98.1 F (36.7 C)  TempSrc: Oral  SpO2: 96%  Weight: 162 lb 3.2 oz (73.6 kg)  Height: 5\' 9"  (1.753 m)   Results for orders placed or performed in visit on 08/31/18  POCT rapid strep A  Result Value Ref Range   Rapid Strep A Screen Negative Negative      Assessment & Plan:    Victoria Larsen is a 32 y.o. female Sore throat - Plan: POCT rapid strep A, Culture, Group A Strep  Cough  LRTI (lower respiratory tract infection) - Plan: azithromycin (ZITHROMAX) 250 MG tablet  Persistent lower respiratory tract infection, possible early bacterial, start Z-Pak.  Symptomatic care discussed with Mucinex, albuterol as needed with RTC precautions if more frequent use.  Stop Mucinex if increased wheeze.  RTC precautions.   Meds ordered this encounter  Medications  . azithromycin (ZITHROMAX) 250 MG tablet    Sig: Take 2 pills by mouth on day 1, then 1 pill by mouth per day on days 2 through 5.    Dispense:  6 tablet    Refill:  0   Patient Instructions    Albuterol if needed, but return here or other medical provider if you require that inhaler more frequently.   Start antibiotic for possible  early chest infection.   mucinex for cough - stop if that increases wheeze.   Return to the clinic or go to the nearest emergency room if any of your symptoms worsen or new symptoms occur.   Cough, Adult Coughing is a reflex that clears your throat and your airways. Coughing helps to heal and protect your lungs. It is normal to cough occasionally, but a cough that happens with other symptoms or lasts a long time may be a sign of a condition that needs treatment. A cough may last only 2-3 weeks (acute), or it may last longer than 8 weeks (chronic). What are the causes? Coughing is commonly caused by:  Breathing in substances that irritate your lungs.  A viral or bacterial respiratory infection.  Allergies.  Asthma.  Postnasal drip.  Smoking.  Acid backing up from the stomach into the esophagus (gastroesophageal reflux).  Certain medicines.  Chronic lung problems, including COPD (or rarely, lung cancer).  Other medical conditions such as heart failure.  Follow these instructions at home: Pay attention to any changes in your symptoms. Take these actions to help with your discomfort:  Take medicines only as told by your health care provider. ? If you were prescribed an antibiotic medicine, take it as told by your health care provider. Do not stop taking the antibiotic even if you start to feel better. ? Talk with your health care provider before you take a cough suppressant medicine.  Drink enough fluid to keep your urine clear or pale yellow.  If the air is dry, use a cold steam vaporizer or humidifier in your bedroom or your home to help loosen secretions.  Avoid anything that causes you to cough at work or at home.  If your cough is worse at night, try sleeping in a semi-upright position.  Avoid cigarette smoke. If you smoke, quit smoking. If you need help quitting, ask your health care provider.  Avoid caffeine.  Avoid alcohol.  Rest as needed.  Contact a health  care provider if:  You have new symptoms.  You cough up pus.  Your cough does not get better after 2-3 weeks, or your cough gets worse.  You  cannot control your cough with suppressant medicines and you are losing sleep.  You develop pain that is getting worse or pain that is not controlled with pain medicines.  You have a fever.  You have unexplained weight loss.  You have night sweats. Get help right away if:  You cough up blood.  You have difficulty breathing.  Your heartbeat is very fast. This information is not intended to replace advice given to you by your health care provider. Make sure you discuss any questions you have with your health care provider. Document Released: 05/16/2011 Document Revised: 04/24/2016 Document Reviewed: 01/24/2015 Elsevier Interactive Patient Education  [employer].   If you have lab work done today you will be contacted with your lab results within the next 2 weeks.  If you have not heard from [place] then please contact us. The fastest way to get your results is to register for My Chart.   IF you received an x-ray today, you will receive an invoice from [facility]. Please contact [facility] at [phone] with questions or concerns regarding your invoice.   IF you received labwork today, you will receive an invoice from [facility]. Please contact [facility] at [phone] with questions or concerns regarding your invoice.   Our billing staff will not be able to assist you with questions regarding Larsen from these companies.  You will be contacted with the lab results as soon as they are available. The fastest way to get your results is to activate your My Chart account. Instructions are located on the last page of this paperwork. If you have not heard from [place] regarding the results in 2 weeks, please contact this office.       I personally performed the services described in this documentation, which was scribed in my  presence. The recorded information has been reviewed and considered for accuracy and completeness, addended by me as needed, and agree with information above.  Signed,   Meredith Staggers, MD Primary [facility] at [facility].  08/31/18 1:28 PM

## 2018-08-31 NOTE — Patient Instructions (Addendum)
Albuterol if needed, but return here or other medical provider if you require that inhaler more frequently.   Start antibiotic for possible early chest infection.   mucinex for cough - stop if that increases wheeze.   Return to the clinic or go to the nearest emergency room if any of your symptoms worsen or new symptoms occur.   Cough, Adult Coughing is a reflex that clears your throat and your airways. Coughing helps to heal and protect your lungs. It is normal to cough occasionally, but a cough that happens with other symptoms or lasts a long time may be a sign of a condition that needs treatment. A cough may last only 2-3 weeks (acute), or it may last longer than 8 weeks (chronic). What are the causes? Coughing is commonly caused by:  Breathing in substances that irritate your lungs.  A viral or bacterial respiratory infection.  Allergies.  Asthma.  Postnasal drip.  Smoking.  Acid backing up from the stomach into the esophagus (gastroesophageal reflux).  Certain medicines.  Chronic lung problems, including COPD (or rarely, lung cancer).  Other medical conditions such as heart failure.  Follow these instructions at home: Pay attention to any changes in your symptoms. Take these actions to help with your discomfort:  Take medicines only as told by your health care provider. ? If you were prescribed an antibiotic medicine, take it as told by your health care provider. Do not stop taking the antibiotic even if you start to feel better. ? Talk with your health care provider before you take a cough suppressant medicine.  Drink enough fluid to keep your urine clear or pale yellow.  If the air is dry, use a cold steam vaporizer or humidifier in your bedroom or your home to help loosen secretions.  Avoid anything that causes you to cough at work or at home.  If your cough is worse at night, try sleeping in a semi-upright position.  Avoid cigarette smoke. If you smoke,  quit smoking. If you need help quitting, ask your health care provider.  Avoid caffeine.  Avoid alcohol.  Rest as needed.  Contact a health care provider if:  You have new symptoms.  You cough up pus.  Your cough does not get better after 2-3 weeks, or your cough gets worse.  You cannot control your cough with suppressant medicines and you are losing sleep.  You develop pain that is getting worse or pain that is not controlled with pain medicines.  You have a fever.  You have unexplained weight loss.  You have night sweats. Get help right away if:  You cough up blood.  You have difficulty breathing.  Your heartbeat is very fast. This information is not intended to replace advice given to you by your health care provider. Make sure you discuss any questions you have with your health care provider. Document Released: 05/16/2011 Document Revised: 04/24/2016 Document Reviewed: 01/24/2015 Elsevier Interactive Patient Education  [employer].   If you have lab work done today you will be contacted with your lab results within the next 2 weeks.  If you have not heard from [place] then please contact us. The fastest way to get your results is to register for My Chart.   IF you received an x-ray today, you will receive an invoice from [facility]. Please contact [facility] at [phone] with questions or concerns regarding your invoice.   IF you received labwork today, you will receive an invoice from [employer].  Please contact [facility] at [phone] with questions or concerns regarding your invoice.   Our billing staff will not be able to assist you with questions regarding bills from these companies.  You will be contacted with the lab results as soon as they are available. The fastest way to get your results is to activate your My Chart account. Instructions are located on the last page of this paperwork. If you have not heard from [place] regarding the  results in 2 weeks, please contact this office.

## 2018-09-02 LAB — CULTURE, GROUP A STREP: Strep A Culture: NEGATIVE

## 2018-09-03 ENCOUNTER — Other Ambulatory Visit: Payer: Self-pay

## 2018-09-03 ENCOUNTER — Encounter (HOSPITAL_COMMUNITY): Payer: Self-pay

## 2018-09-03 ENCOUNTER — Encounter (HOSPITAL_COMMUNITY)
Admission: RE | Admit: 2018-09-03 | Discharge: 2018-09-03 | Disposition: A | Discharge status: Home / Self Care | Payer: BC Managed Care – PPO | Source: Ambulatory Visit | Attending: Obstetrics & Gynecology | Admitting: Obstetrics & Gynecology

## 2018-09-03 DIAGNOSIS — Z01812 Encounter for preprocedural laboratory examination: Secondary | ICD-10-CM | POA: Insufficient documentation

## 2018-09-03 HISTORY — DX: Gastro-esophageal reflux disease without esophagitis: K21.9

## 2018-09-03 HISTORY — DX: Anemia, unspecified: D64.9

## 2018-09-03 HISTORY — DX: Other chronic pain: G89.29

## 2018-09-03 HISTORY — DX: Cervicalgia: M54.2

## 2018-09-03 LAB — CBC
HCT: 39.3 % (ref 36.0–46.0)
Hemoglobin: 13.1 g/dL (ref 12.0–15.0)
MCH: 32.2 pg (ref 26.0–34.0)
MCHC: 33.3 g/dL (ref 30.0–36.0)
MCV: 96.6 fL (ref 78.0–100.0)
PLATELETS: 304 10*3/uL (ref 150–400)
RBC: 4.07 MIL/uL (ref 3.87–5.11)
RDW: 13 % (ref 11.5–15.5)
WBC: 6.5 10*3/uL (ref 4.0–10.5)

## 2018-09-03 LAB — TYPE AND SCREEN
ABO/RH(D): O POS
Antibody Screen: NEGATIVE

## 2018-09-08 ENCOUNTER — Other Ambulatory Visit: Payer: Self-pay

## 2018-09-08 ENCOUNTER — Encounter (HOSPITAL_COMMUNITY): Payer: Self-pay | Admitting: Anesthesiology

## 2018-09-08 ENCOUNTER — Ambulatory Visit (HOSPITAL_COMMUNITY): Payer: BC Managed Care – PPO | Admitting: Anesthesiology

## 2018-09-08 ENCOUNTER — Ambulatory Visit (HOSPITAL_COMMUNITY)
Admission: AD | Admit: 2018-09-08 | Discharge: 2018-09-08 | Disposition: A | Discharge status: Home / Self Care | Payer: BC Managed Care – PPO | Source: Ambulatory Visit | Attending: Obstetrics & Gynecology | Admitting: Obstetrics & Gynecology

## 2018-09-08 ENCOUNTER — Encounter (HOSPITAL_COMMUNITY)
Admission: AD | Disposition: A | Discharge status: Home / Self Care | Payer: Self-pay | Source: Ambulatory Visit | Attending: Obstetrics & Gynecology

## 2018-09-08 DIAGNOSIS — Z881 Allergy status to other antibiotic agents status: Secondary | ICD-10-CM | POA: Diagnosis not present

## 2018-09-08 DIAGNOSIS — Z885 Allergy status to narcotic agent status: Secondary | ICD-10-CM | POA: Insufficient documentation

## 2018-09-08 DIAGNOSIS — R1031 Right lower quadrant pain: Secondary | ICD-10-CM | POA: Diagnosis not present

## 2018-09-08 DIAGNOSIS — G4700 Insomnia, unspecified: Secondary | ICD-10-CM | POA: Diagnosis not present

## 2018-09-08 DIAGNOSIS — K219 Gastro-esophageal reflux disease without esophagitis: Secondary | ICD-10-CM | POA: Diagnosis not present

## 2018-09-08 DIAGNOSIS — Z87891 Personal history of nicotine dependence: Secondary | ICD-10-CM | POA: Diagnosis not present

## 2018-09-08 DIAGNOSIS — F419 Anxiety disorder, unspecified: Secondary | ICD-10-CM | POA: Diagnosis not present

## 2018-09-08 DIAGNOSIS — Z79899 Other long term (current) drug therapy: Secondary | ICD-10-CM | POA: Insufficient documentation

## 2018-09-08 DIAGNOSIS — Z8249 Family history of ischemic heart disease and other diseases of the circulatory system: Secondary | ICD-10-CM | POA: Insufficient documentation

## 2018-09-08 DIAGNOSIS — J45909 Unspecified asthma, uncomplicated: Secondary | ICD-10-CM | POA: Insufficient documentation

## 2018-09-08 DIAGNOSIS — M797 Fibromyalgia: Secondary | ICD-10-CM | POA: Insufficient documentation

## 2018-09-08 DIAGNOSIS — Z880 Allergy status to penicillin: Secondary | ICD-10-CM | POA: Insufficient documentation

## 2018-09-08 DIAGNOSIS — R102 Pelvic and perineal pain: Secondary | ICD-10-CM | POA: Insufficient documentation

## 2018-09-08 HISTORY — PX: LAPAROSCOPY: SHX197

## 2018-09-08 SURGERY — LAPAROSCOPY, DIAGNOSTIC
Anesthesia: General | Site: Abdomen | Laterality: Right

## 2018-09-08 MED ORDER — OXYCODONE HCL 5 MG PO TABS
ORAL_TABLET | ORAL | Status: AC
  Filled 2018-09-08: qty 1

## 2018-09-08 MED ORDER — KETOROLAC TROMETHAMINE 30 MG/ML IJ SOLN: 30.0000 mg | Freq: Once | INTRAMUSCULAR | Status: DC | PRN

## 2018-09-08 MED ORDER — LIDOCAINE HCL (CARDIAC) PF 100 MG/5ML IV SOSY
PREFILLED_SYRINGE | INTRAVENOUS | Status: DC | PRN
  Administered 2018-09-08: 80 mg via INTRAVENOUS

## 2018-09-08 MED ORDER — LIDOCAINE 2% (20 MG/ML) 5 ML SYRINGE
INTRAMUSCULAR | Status: DC | PRN
  Administered 2018-09-08: 1.5 mg/kg/h via INTRAVENOUS

## 2018-09-08 MED ORDER — BUPIVACAINE HCL (PF) 0.25 % IJ SOLN
INTRAMUSCULAR | Status: AC
  Filled 2018-09-08: qty 30

## 2018-09-08 MED ORDER — METHYLENE BLUE 0.5 % INJ SOLN
INTRAVENOUS | Status: AC
  Filled 2018-09-08: qty 10

## 2018-09-08 MED ORDER — OXYCODONE HCL 5 MG/5ML PO SOLN: 5.0000 mg | Freq: Once | ORAL | Status: AC | PRN

## 2018-09-08 MED ORDER — ROCURONIUM BROMIDE 100 MG/10ML IV SOLN
INTRAVENOUS | Status: DC | PRN
  Administered 2018-09-08: 50 mg via INTRAVENOUS

## 2018-09-08 MED ORDER — ROCURONIUM BROMIDE 100 MG/10ML IV SOLN
INTRAVENOUS | Status: AC
  Filled 2018-09-08: qty 1

## 2018-09-08 MED ORDER — BUPIVACAINE HCL (PF) 0.25 % IJ SOLN
INTRAMUSCULAR | Status: DC | PRN
  Administered 2018-09-08: 6 mL

## 2018-09-08 MED ORDER — SUGAMMADEX SODIUM 200 MG/2ML IV SOLN
INTRAVENOUS | Status: DC | PRN
  Administered 2018-09-08: 200 mg via INTRAVENOUS

## 2018-09-08 MED ORDER — LACTATED RINGERS IV SOLN
INTRAVENOUS | Status: DC
  Administered 2018-09-08: 125 mL/h via INTRAVENOUS
  Administered 2018-09-08: 09:00:00 via INTRAVENOUS

## 2018-09-08 MED ORDER — KETOROLAC TROMETHAMINE 30 MG/ML IJ SOLN
INTRAMUSCULAR | Status: DC | PRN
  Administered 2018-09-08: 30 mg via INTRAVENOUS

## 2018-09-08 MED ORDER — ONDANSETRON HCL 4 MG/2ML IJ SOLN: 4.0000 mg | Freq: Once | INTRAMUSCULAR | Status: DC | PRN

## 2018-09-08 MED ORDER — MIDAZOLAM HCL 2 MG/2ML IJ SOLN
INTRAMUSCULAR | Status: DC | PRN
  Administered 2018-09-08: 2 mg via INTRAVENOUS

## 2018-09-08 MED ORDER — IBUPROFEN 200 MG PO TABS: 600.0000 mg | ORAL_TABLET | Freq: Four times a day (QID) | ORAL | 0 refills | Status: DC | PRN

## 2018-09-08 MED ORDER — PROPOFOL 10 MG/ML IV BOLUS
INTRAVENOUS | Status: DC | PRN
  Administered 2018-09-08: 180 mg via INTRAVENOUS

## 2018-09-08 MED ORDER — FENTANYL CITRATE (PF) 100 MCG/2ML IJ SOLN
25.0000 ug | INTRAMUSCULAR | Status: DC | PRN
  Administered 2018-09-08 (×2): 50 ug via INTRAVENOUS

## 2018-09-08 MED ORDER — KETOROLAC TROMETHAMINE 30 MG/ML IJ SOLN
INTRAMUSCULAR | Status: AC
  Filled 2018-09-08: qty 1

## 2018-09-08 MED ORDER — MIDAZOLAM HCL 2 MG/2ML IJ SOLN
INTRAMUSCULAR | Status: AC
  Filled 2018-09-08: qty 2

## 2018-09-08 MED ORDER — PROPOFOL 10 MG/ML IV BOLUS
INTRAVENOUS | Status: AC
  Filled 2018-09-08: qty 20

## 2018-09-08 MED ORDER — FENTANYL CITRATE (PF) 100 MCG/2ML IJ SOLN
INTRAMUSCULAR | Status: AC
  Administered 2018-09-08: 50 ug via INTRAVENOUS
  Filled 2018-09-08: qty 2

## 2018-09-08 MED ORDER — DEXAMETHASONE SODIUM PHOSPHATE 10 MG/ML IJ SOLN
INTRAMUSCULAR | Status: DC | PRN
  Administered 2018-09-08: 10 mg via INTRAVENOUS

## 2018-09-08 MED ORDER — SCOPOLAMINE 1 MG/3DAYS TD PT72
MEDICATED_PATCH | TRANSDERMAL | Status: AC
  Administered 2018-09-08: 1.5 mg via TRANSDERMAL
  Filled 2018-09-08: qty 1

## 2018-09-08 MED ORDER — PHENYLEPHRINE 40 MCG/ML (10ML) SYRINGE FOR IV PUSH (FOR BLOOD PRESSURE SUPPORT)
PREFILLED_SYRINGE | INTRAVENOUS | Status: DC | PRN
  Administered 2018-09-08: 80 ug via INTRAVENOUS

## 2018-09-08 MED ORDER — GLYCOPYRROLATE 0.2 MG/ML IJ SOLN
INTRAMUSCULAR | Status: DC | PRN
  Administered 2018-09-08: 0.1 mg via INTRAVENOUS

## 2018-09-08 MED ORDER — DEXAMETHASONE SODIUM PHOSPHATE 10 MG/ML IJ SOLN
INTRAMUSCULAR | Status: AC
  Filled 2018-09-08: qty 1

## 2018-09-08 MED ORDER — FENTANYL CITRATE (PF) 250 MCG/5ML IJ SOLN
INTRAMUSCULAR | Status: AC
  Filled 2018-09-08: qty 5

## 2018-09-08 MED ORDER — IBUPROFEN 100 MG/5ML PO SUSP
200.0000 mg | Freq: Four times a day (QID) | ORAL | Status: DC | PRN
  Filled 2018-09-08: qty 20

## 2018-09-08 MED ORDER — IBUPROFEN 200 MG PO TABS
200.0000 mg | ORAL_TABLET | Freq: Four times a day (QID) | ORAL | Status: DC | PRN
  Filled 2018-09-08: qty 2

## 2018-09-08 MED ORDER — OXYCODONE-ACETAMINOPHEN 5-325 MG PO TABS: 2.0000 | ORAL_TABLET | Freq: Four times a day (QID) | ORAL | 0 refills | Status: AC | PRN

## 2018-09-08 MED ORDER — MEPERIDINE HCL 25 MG/ML IJ SOLN: 6.2500 mg | INTRAMUSCULAR | Status: DC | PRN

## 2018-09-08 MED ORDER — FENTANYL CITRATE (PF) 100 MCG/2ML IJ SOLN
INTRAMUSCULAR | Status: DC | PRN
  Administered 2018-09-08 (×3): 50 ug via INTRAVENOUS
  Administered 2018-09-08: 100 ug via INTRAVENOUS

## 2018-09-08 MED ORDER — LIDOCAINE HCL (CARDIAC) PF 100 MG/5ML IV SOSY
PREFILLED_SYRINGE | INTRAVENOUS | Status: AC
  Filled 2018-09-08: qty 5

## 2018-09-08 MED ORDER — SCOPOLAMINE 1 MG/3DAYS TD PT72
1.0000 | MEDICATED_PATCH | Freq: Once | TRANSDERMAL | Status: DC
  Administered 2018-09-08: 1.5 mg via TRANSDERMAL

## 2018-09-08 MED ORDER — SUGAMMADEX SODIUM 200 MG/2ML IV SOLN
INTRAVENOUS | Status: AC
  Filled 2018-09-08: qty 2

## 2018-09-08 MED ORDER — ONDANSETRON HCL 4 MG/2ML IJ SOLN
INTRAMUSCULAR | Status: DC | PRN
  Administered 2018-09-08: 4 mg via INTRAVENOUS

## 2018-09-08 MED ORDER — ONDANSETRON HCL 4 MG/2ML IJ SOLN
INTRAMUSCULAR | Status: AC
  Filled 2018-09-08: qty 2

## 2018-09-08 MED ORDER — OXYCODONE HCL 5 MG PO TABS
5.0000 mg | ORAL_TABLET | Freq: Once | ORAL | Status: AC | PRN
  Administered 2018-09-08: 5 mg via ORAL

## 2018-09-08 SURGICAL SUPPLY — 32 items
ADH SKN CLS APL DERMABOND .7 (GAUZE/BANDAGES/DRESSINGS) ×1
BAG SPEC RTRVL LRG 6X4 10 (ENDOMECHANICALS)
CABLE HIGH FREQUENCY MONO STRZ (ELECTRODE) IMPLANT
CATH ROBINSON RED A/P 16FR (CATHETERS) ×2 IMPLANT
DERMABOND ADVANCED (GAUZE/BANDAGES/DRESSINGS) ×1
DERMABOND ADVANCED .7 DNX12 (GAUZE/BANDAGES/DRESSINGS) ×1 IMPLANT
DRSG OPSITE POSTOP 3X4 (GAUZE/BANDAGES/DRESSINGS) ×1 IMPLANT
DURAPREP 26ML APPLICATOR (WOUND CARE) ×2 IMPLANT
FILTER SMOKE EVAC LAPAROSHD (FILTER) IMPLANT
GLOVE BIO SURGEON STRL SZ7 (GLOVE) ×2 IMPLANT
GLOVE BIOGEL PI IND STRL 7.0 (GLOVE) ×2 IMPLANT
GLOVE BIOGEL PI INDICATOR 7.0 (GLOVE) ×2
GOWN STRL REUS W/TWL LRG LVL3 (GOWN DISPOSABLE) ×6 IMPLANT
LIGASURE VESSEL 5MM BLUNT TIP (ELECTROSURGICAL) IMPLANT
MANIPULATOR UTERINE 4.5 ZUMI (MISCELLANEOUS) ×2 IMPLANT
NS IRRIG 1000ML POUR BTL (IV SOLUTION) ×2 IMPLANT
PACK LAPAROSCOPY BASIN (CUSTOM PROCEDURE TRAY) ×2 IMPLANT
PACK TRENDGUARD 450 HYBRID PRO (MISCELLANEOUS) IMPLANT
POUCH SPECIMEN RETRIEVAL 10MM (ENDOMECHANICALS) IMPLANT
PROTECTOR NERVE ULNAR (MISCELLANEOUS) ×4 IMPLANT
SCISSORS LAP 5X35 DISP (ENDOMECHANICALS) IMPLANT
SET IRRIG TUBING LAPAROSCOPIC (IRRIGATION / IRRIGATOR) IMPLANT
SLEEVE XCEL OPT CAN 5 100 (ENDOMECHANICALS) ×2 IMPLANT
SOLUTION ELECTROLUBE (MISCELLANEOUS) ×1 IMPLANT
SUT VICRYL 0 UR6 27IN ABS (SUTURE) ×2 IMPLANT
SUT VICRYL 4-0 PS2 18IN ABS (SUTURE) ×2 IMPLANT
TOWEL OR 17X24 6PK STRL BLUE (TOWEL DISPOSABLE) ×4 IMPLANT
TRENDGUARD 450 HYBRID PRO PACK (MISCELLANEOUS) ×2
TROCAR BALLN 12MMX100 BLUNT (TROCAR) ×2 IMPLANT
TROCAR XCEL NON-BLD 5MMX100MML (ENDOMECHANICALS) ×2 IMPLANT
TUBING INSUF HEATED (TUBING) ×2 IMPLANT
WARMER LAPAROSCOPE (MISCELLANEOUS) ×2 IMPLANT

## 2018-09-08 NOTE — H&P (Signed)
Victoria Larsen is an 32 y.o. female here for Laparoscopy for persistent right pelvic pain and persistent 3.5 cm complex right ovarian cyst. FamHx of endometriosis- sister   Old records reviewed -Pt underwent L/scopy in 2011 for right ovarian cyst, was corpus luteum cyst, it was cauterized and then she went back to OR from PACU for hemodynamic instability, hemoperitoneum was found and right ovary was cauterized again. No endometriosis was mentioned.   Single at present. On OCs trial to assess if pain would resolve. Sees GI for IBS but feels this pain is not GI origin. Musculoskeletal pain possibility reviewed.  Nl Pap, no breast complaint. Regular menses. Patient's last menstrual period was 08/11/2018 (approximate).    Past Medical History:  Diagnosis Date  . Allergy   . Anemia   . Anxiety   . Asthma   . Chronic neck pain   . Fibromyalgia   . GERD (gastroesophageal reflux disease)   . Insomnia     Past Surgical History:  Procedure Laterality Date  . colonoscopy    . OVARIAN CYST SURGERY    . TONSILLECTOMY    . UPPER GI ENDOSCOPY    . wisdom teeth ext      Family History  Problem Relation Age of Onset  . Heart disease Maternal Grandmother   . Stroke Paternal Grandfather     Social History:  reports that she quit smoking about 9 years ago. Her smoking use included cigarettes. She has a 1.50 pack-year smoking history. She has never used smokeless tobacco. She reports that she does not drink alcohol or use drugs.  Allergies:  Allergies  Allergen Reactions  . Metronidazole   . Penicillins Rash    Infant Has patient had a PCN reaction causing immediate rash, facial/tongue/throat swelling, SOB or lightheadedness with hypotension: yes Has patient had a PCN reaction causing severe rash involving mucus membranes or skin necrosis: no Has patient had a PCN reaction that required hospitalization: no Has patient had a PCN reaction occurring within the last 10 years: no If all of  the above answers are "NO", then may proceed with Cephalosporin use.     Medications Prior to Admission  Medication Sig Dispense Refill Last Dose  . albuterol (PROVENTIL HFA;VENTOLIN HFA) 108 (90 BASE) MCG/ACT inhaler Inhale 2 puffs into the lungs every 6 (six) hours as needed for shortness of breath.    09/07/2018 at 2230  . albuterol (PROVENTIL) (2.5 MG/3ML) 0.083% nebulizer solution Take 2.5 mg by nebulization every 6 (six) hours as needed for shortness of breath.    Past Month at Unknown time  . ASHWAGANDHA PO Take 1 capsule by mouth daily.   Past Week at Unknown time  . cyclobenzaprine (FLEXERIL) 10 MG tablet Take 10 mg by mouth 3 (three) times daily as needed for muscle spasms.    Taking  . ipratropium-albuterol (DUONEB) 0.5-2.5 (3) MG/3ML SOLN Take 3 mLs by nebulization every 6 (six) hours as needed. 360 mL 3 Past Month at Unknown time  . Norethin-Eth Estrad-Fe Biphas (LO LOESTRIN FE PO) Take 1 tablet by mouth daily.    Past Week at Unknown time  . omeprazole (PRILOSEC) 40 MG capsule Take 40 mg by mouth daily.    Past Week at Unknown time  . OVER THE COUNTER MEDICATION Take 1 capsule by mouth daily as needed (FDGARD).   Past Week at Unknown time  . OVER THE COUNTER MEDICATION Take 1 capsule by mouth daily. [facility] [place]   Past Week at Unknown time  .  OVER THE COUNTER MEDICATION Take 1 capsule by mouth daily. 5-Hydroxytryptophan (5-HTP)   Past Week at Unknown time  . Peppermint Oil (IBGARD) 90 MG CPCR Take 1 capsule by mouth daily as needed (IBS).   Past Week at Unknown time  . azithromycin (ZITHROMAX) 250 MG tablet Take 2 pills by mouth on day 1, then 1 pill by mouth per day on days 2 through 5. 6 tablet 0     ROS neg   Blood pressure 96/73, pulse 74, temperature 98.2 F (36.8 C), temperature source Oral, resp. rate 16, last menstrual period 08/11/2018, SpO2 98 %. Physical Exam Physical exam:  A&O x 3, no acute distress. Pleasant HEENT neg, no thyromegaly Lungs CTA bilat CV RRR, S1S2  normal Abdo soft, non tender, non acute Extr no edema/ tenderness Pelvic normal cervix, uterus and adnexa  No results found for this or any previous visit (from the past 24 hour(s)).  No results found.  Assessment/Plan: 32 yo with persistent RLQ pain and persistent right ovarian complex cyst 3.5 cm. Here for Laparoscopy, assess and ablate endometriosis if found, right ovarian cystectomy if found.   Risks/complications of surgery reviewed incl infection, bleeding, damage to internal organs including bladder, bowels, ureters, blood vessels, other risks from anesthesia, VTE and delayed complications of any surgery, complications in future surgery reviewed.  Robley Fries 09/08/2018, 7:33 AM

## 2018-09-08 NOTE — Anesthesia Preprocedure Evaluation (Addendum)
Anesthesia Evaluation  Patient identified by MRN, date of birth, ID band Patient awake    Reviewed: Allergy & Precautions, NPO status , Patient's Chart, lab work & pertinent test results  History of Anesthesia Complications Negative for: history of anesthetic complications  Airway Mallampati: II  TM Distance: >3 FB Neck ROM: Full    Dental no notable dental hx. (+) Teeth Intact, Dental Advisory Given   Pulmonary asthma , former smoker,    Pulmonary exam normal breath sounds clear to auscultation       Cardiovascular negative cardio ROS Normal cardiovascular exam Rhythm:Regular Rate:Normal     Neuro/Psych Anxiety    GI/Hepatic Neg liver ROS, GERD  Controlled,  Endo/Other  negative endocrine ROS  Renal/GU negative Renal ROS     Musculoskeletal  (+) Fibromyalgia -  Abdominal   Peds  Hematology negative hematology ROS (+)   Anesthesia Other Findings   Reproductive/Obstetrics                            Anesthesia Physical Anesthesia Plan  ASA: II  Anesthesia Plan: General   Post-op Pain Management:    Induction:   PONV Risk Score and Plan: 4 or greater and Ondansetron, Dexamethasone, Midazolam and Scopolamine patch - Pre-op  Airway Management Planned: Oral ETT  Additional Equipment:   Intra-op Plan:   Post-operative Plan: Extubation in OR  Informed Consent: I have reviewed the patients History and Physical, chart, labs and discussed the procedure including the risks, benefits and alternatives for the proposed anesthesia with the patient or authorized representative who has indicated his/her understanding and acceptance.   Dental advisory given  Plan Discussed with: CRNA and Surgeon  Anesthesia Plan Comments:        Anesthesia Quick Evaluation

## 2018-09-08 NOTE — Transfer of Care (Signed)
Immediate Anesthesia Transfer of Care Note  Patient: Victoria Larsen  Procedure(s) Performed: LAPAROSCOPY DIAGNOSTIC/Possible Right Ovarian Cystectomy/Possible Ablation of Endometriosis (Right Abdomen)  Patient Location: PACU  Anesthesia Type:General  Level of Consciousness: awake, alert  and oriented  Airway & Oxygen Therapy: Patient Spontanous Breathing and Patient connected to nasal cannula oxygen  Post-op Assessment: Report given to RN and Post -op Vital signs reviewed and stable  Post vital signs: Reviewed and stable  Last Vitals:  Vitals Value Taken Time  BP 122/67 09/08/2018  9:24 AM  Temp    Pulse 76 09/08/2018  9:27 AM  Resp 15 09/08/2018  9:27 AM  SpO2 100 % 09/08/2018  9:27 AM  Vitals shown include unvalidated device data.  Last Pain:  Vitals:   09/08/18 0714  TempSrc: Oral  PainSc: 2       Patients Stated Pain Goal: 3 (09/08/18 5573)  Complications: No apparent anesthesia complications

## 2018-09-08 NOTE — Op Note (Signed)
Preoperative diagnosis: Right lower pelvic pain, right ovarian cyst  Postoperative diagnosis: Same, normal pelvis and abdomen. Procedure: Diagnostic Laparoscopy Surgeon: Shea Evans, MD Assistants: Carlean Jews, CNM Anesthesia Gen. Endotracheal IV fluids LR 900 cc EBL minimal 3 cc Urine clear (straight cath pre-op) 25 cc Complications none Disposition PACU and home Specimens none  Procedure Patient presents for diagnostic possible operative laparoscopy.   Risk and complications of surgery including infection, bleeding, damage to internal organs, other complications including pneumonia, VTE were reviewed.  Patient voiced understanding. Informed written consent was obtained and patient was brought to the operating room with IV running.  Timeout was carried out. She underwent general anesthesia without difficulty and was given dorsal lithotomy position. Pelvic examination under anesthesia was normal. She was prepped and draped in standard fashion. Bladder was emptied with straight catheter. Speculum was placed anterior lip of cervix was grasped with tenaculum, uterus was sounded to 7 cm . Hulka manipulator was inserted and secured to cervix. Gloves gown were changed attention was focused on the abdomen.  A  10 mm curved incision was made at the lower edge of the umbilicus through prior scar. Incision was carried down to the fascia was incised. Peritoneal entry made under vision. Hassan canula inserted and balloon secured. Insufflation was begun with CO2 and a 0 laparoscope was introduced. No bleeding noted at entry site. Internal organs appear normal when trendelenburg given.  A 5 mm trocar was inserted in right lower quadrant under vision after injecting marcaine. Thorough evaluation of pelvis and abdomen noted normal uterus, tubes and ovaries, cul de sac, ovarian fossa, ureters. No adhesions or endometriosis noted. Cul-de-sac and anterior peritoneum appeared normal. Liver and bowels, and  appendix was not able to be visualized. No adhesions from prior laparoscopy noted.    Diagnostic laparoscopy was completed and normal. Instruments and trocars removed, pneumoperitoneum was released. Fascial incision closed with single stitch of 0-Vicryl. The skin at both incisions approximated using 4-0 Vicryl in subcuticular fashion. Dermabond was applied at the incision. The uterine manipulator was removed. Hemostasis was excellent.  All counts were correct x 2.  Patient brought out to the recovery room after extubation in stable condition. Plan is to discharge home from recovery room. Surgical findings were discussed with patient's family.  I performed the surgery. Followup with Dr. Juliene Pina in office in 2 weeks.  V.Ansley Mangiapane MD

## 2018-09-08 NOTE — Discharge Instructions (Signed)
Diagnostic Laparoscopy, Care After °Refer to this sheet in the next few weeks. These instructions provide you with information about caring for yourself after your procedure. Your health care provider may also give you more specific instructions. Your treatment has been planned according to current medical practices, but problems sometimes occur. Call your health care provider if you have any problems or questions after your procedure. °What can I expect after the procedure? °After your procedure, it is common to have mild discomfort in the throat and abdomen. °Follow these instructions at home: °· Take over-the-counter and prescription medicines only as told by your health care provider. °· Do not drive for 24 hours if you received a sedative. °· Return to your normal activities as told by your health care provider. °· Do not take baths, swim, or use a hot tub until your health care provider approves. You may shower. °· Follow instructions from your health care provider about how to take care of your incision. Make sure you: °? Wash your hands with soap and water before you change your bandage (dressing). If soap and water are not available, use hand sanitizer. °? Change your dressing as told by your health care provider. °? Leave stitches (sutures), skin glue, or adhesive strips in place. These skin closures may need to stay in place for 2 weeks or longer. If adhesive strip edges start to loosen and curl up, you may trim the loose edges. Do not remove adhesive strips completely unless your health care provider tells you to do that. °· Check your incision area every day for signs of infection. Check for: °? More redness, swelling, or pain. °? More fluid or blood. °? Warmth. °? Pus or a bad smell. °· It is your responsibility to get the results of your procedure. Ask your health care provider or the department performing the procedure when your results will be ready. °Contact a health care provider if: °· There is  new pain in your shoulders. °· You feel light-headed or faint. °· You are unable to pass gas or unable to have a bowel movement. °· You feel nauseous or you vomit. °· You develop a rash. °· You have more redness, swelling, or pain around your incision. °· You have more fluid or blood coming from your incision. °· Your incision feels warm to the touch. °· You have pus or a bad smell coming from your incision. °· You have a fever or chills. °Get help right away if: °· Your pain is getting worse. °· You have ongoing vomiting. °· The edges of your incision open up. °· You have trouble breathing. °· You have chest pain. °This information is not intended to replace advice given to you by your health care provider. Make sure you discuss any questions you have with your health care provider. °Document Released: 10/29/2015 Document Revised: 04/24/2016 Document Reviewed: 07/31/2015 °Elsevier Interactive Patient Education © 2018 Elsevier Inc. ° °Post Anesthesia Home Care Instructions ° °Activity: °Get plenty of rest for the remainder of the day. A responsible individual must stay with you for 24 hours following the procedure.  °For the next 24 hours, DO NOT: °-Drive a car °-Operate machinery °-Drink alcoholic beverages °-Take any medication unless instructed by your physician °-Make any legal decisions or sign important papers. ° °Meals: °Start with liquid foods such as gelatin or soup. Progress to regular foods as tolerated. Avoid greasy, spicy, heavy foods. If nausea and/or vomiting occur, drink only clear liquids until the nausea and/or vomiting subsides.   Call your physician if vomiting continues. ° °Special Instructions/Symptoms: °Your throat may feel dry or sore from the anesthesia or the breathing tube placed in your throat during surgery. If this causes discomfort, gargle with warm salt water. The discomfort should disappear within 24 hours. ° °If you had a scopolamine patch placed behind your ear for the management of  post- operative nausea and/or vomiting: ° °1. The medication in the patch is effective for 72 hours, after which it should be removed.  Wrap patch in a tissue and discard in the trash. Wash hands thoroughly with soap and water. °2. You may remove the patch earlier than 72 hours if you experience unpleasant side effects which may include dry mouth, dizziness or visual disturbances. °3. Avoid touching the patch. Wash your hands with soap and water after contact with the patch. °  ° °

## 2018-09-08 NOTE — Anesthesia Procedure Notes (Signed)
Procedure Name: Intubation Date/Time: 09/08/2018 8:36 AM Performed by: [place] Ethleen Lormand C, CRNA Pre-anesthesia Checklist: Patient identified, Emergency Drugs available, Suction available and Patient being monitored Patient Re-evaluated:Patient Re-evaluated prior to induction Oxygen Delivery Method: Circle system utilized Preoxygenation: Pre-oxygenation with 100% oxygen Induction Type: IV induction Ventilation: Mask ventilation without difficulty Laryngoscope Size: Mac and 3 Grade View: Grade II Tube type: Oral Tube size: 7.0 mm Number of attempts: 1 Airway Equipment and Method: Stylet and Oral airway Placement Confirmation: ETT inserted through vocal cords under direct vision,  positive ETCO2 and breath sounds checked- equal and bilateral Secured at: 20 cm Tube secured with: Tape Dental Injury: Teeth and Oropharynx as per pre-operative assessment

## 2018-09-08 NOTE — Anesthesia Postprocedure Evaluation (Signed)
Anesthesia Post Note  Patient: Victoria Larsen  Procedure(s) Performed: LAPAROSCOPY DIAGNOSTIC/Possible Right Ovarian Cystectomy/Possible Ablation of Endometriosis (Right Abdomen)     Patient location during evaluation: PACU Anesthesia Type: General Level of consciousness: awake and alert Pain management: pain level controlled Vital Signs Assessment: post-procedure vital signs reviewed and stable Respiratory status: spontaneous breathing, nonlabored ventilation and respiratory function stable Cardiovascular status: blood pressure returned to baseline and stable Postop Assessment: no apparent nausea or vomiting Anesthetic complications: no    Last Vitals:  Vitals:   09/08/18 0930 09/08/18 0945  BP: 104/90 104/64  Pulse: 64 (!) 107  Resp: 18 18  Temp:    SpO2: 100% 95%    Last Pain:  Vitals:   09/08/18 0945  TempSrc:   PainSc: 4    Pain Goal: Patients Stated Pain Goal: 3 (09/08/18 0714)               Kaylyn Layer

## 2018-09-09 ENCOUNTER — Encounter (HOSPITAL_COMMUNITY): Payer: Self-pay | Admitting: Obstetrics & Gynecology

## 2018-10-08 ENCOUNTER — Encounter: Payer: Self-pay | Admitting: Physician Assistant

## 2018-10-08 DIAGNOSIS — G43909 Migraine, unspecified, not intractable, without status migrainosus: Secondary | ICD-10-CM | POA: Insufficient documentation

## 2018-10-25 ENCOUNTER — Encounter: Payer: Self-pay | Admitting: *Deleted

## 2018-11-09 ENCOUNTER — Encounter

## 2018-11-09 ENCOUNTER — Other Ambulatory Visit: Payer: Self-pay

## 2018-11-09 ENCOUNTER — Ambulatory Visit: Payer: BC Managed Care – PPO | Admitting: Emergency Medicine

## 2018-11-09 ENCOUNTER — Encounter: Payer: Self-pay | Admitting: Emergency Medicine

## 2018-11-09 VITALS — BP 101/69 | HR 63 | Temp 98.3°F | Resp 18 | Ht 67.5 in | Wt 168.2 lb

## 2018-11-09 DIAGNOSIS — N63 Unspecified lump in unspecified breast: Secondary | ICD-10-CM

## 2018-11-09 NOTE — Progress Notes (Signed)
Victoria Larsen 32 y.o.   Chief Complaint  Patient presents with  . Breast Mass    left breast has a lump x1week     HISTORY OF PRESENT ILLNESS: This is a 32 y.o. female complaining of lump to her left breast for the past week.  Denies nipple discharge.  Denies injury.  No other significant symptoms.  HPI   Prior to Admission medications   Medication Sig Start Date End Date Taking? Authorizing Provider  albuterol (PROVENTIL HFA;VENTOLIN HFA) 108 (90 BASE) MCG/ACT inhaler Inhale 2 puffs into the lungs every 6 (six) hours as needed for shortness of breath.    Yes [provider]  albuterol (PROVENTIL) (2.5 MG/3ML) 0.083% nebulizer solution Take 2.5 mg by nebulization every 6 (six) hours as needed for shortness of breath.    Yes [provider]  ASHWAGANDHA PO Take 1 capsule by mouth daily.   Yes [provider]  cyclobenzaprine (FLEXERIL) 10 MG tablet Take 10 mg by mouth 3 (three) times daily as needed for muscle spasms.    Yes [provider]  ibuprofen (ADVIL) 200 MG tablet Take 3 tablets (600 mg total) by mouth every 6 (six) hours as needed. 09/08/18  Yes Shea Evans, MD  ipratropium-albuterol (DUONEB) 0.5-2.5 (3) MG/3ML SOLN Take 3 mLs by nebulization every 6 (six) hours as needed. 06/13/12  Yes Daub, Maylon Peppers, MD  Norethin-Eth Estrad-Fe Biphas (LO LOESTRIN FE PO) Take 1 tablet by mouth daily.    Yes [provider]  omeprazole (PRILOSEC) 40 MG capsule Take 40 mg by mouth daily.    Yes [provider]  OVER THE COUNTER MEDICATION Take 1 capsule by mouth daily as needed (FDGARD).   Yes [provider]  OVER THE COUNTER MEDICATION Take 1 capsule by mouth daily. Holy Basil   Yes [provider]  OVER THE COUNTER MEDICATION Take 1 capsule by mouth daily. 5-Hydroxytryptophan (5-HTP)   Yes [provider]  Peppermint Oil (IBGARD) 90 MG CPCR Take 1 capsule by mouth daily as needed (IBS).   Yes [provider]    Allergies  Allergen Reactions  . Vicodin [Hydrocodone-Acetaminophen] Nausea And Vomiting  . Metronidazole   . Penicillins Rash    Infant Has patient had a PCN reaction causing immediate rash, facial/tongue/throat swelling, SOB or lightheadedness with hypotension: yes Has patient had a PCN reaction causing severe rash involving mucus membranes or skin necrosis: no Has patient had a PCN reaction that required hospitalization: no Has patient had a PCN reaction occurring within the last 10 years: no If all of the above answers are "NO", then may proceed with Cephalosporin use.     Patient Active Problem List   Diagnosis Date Noted  . Migraine 10/08/2018  . History of asthma 12/22/2017  . Influenza with respiratory manifestation 12/22/2017  . Flu-like symptoms 12/22/2017    Past Medical History:  Diagnosis Date  . Allergy   . Anemia   . Anxiety   . Asthma   . Chronic neck pain   . Fibromyalgia   . GERD (gastroesophageal reflux disease)   . Insomnia     Past Surgical History:  Procedure Laterality Date  . colonoscopy    . LAPAROSCOPY Right 09/08/2018   Procedure: LAPAROSCOPY DIAGNOSTIC/Possible Right Ovarian Cystectomy/Possible Ablation of Endometriosis;  Surgeon: Shea Evans, MD;  Location: [facility];  Service: Gynecology;  Laterality: Right;  . OVARIAN CYST SURGERY    . TONSILLECTOMY    . UPPER GI ENDOSCOPY    .  wisdom teeth ext      Social History   Socioeconomic History  . Marital status: Single    Spouse name: Not on file  . Number of children: Not on file  . Years of education: Not on file  . Highest education level: Not on file  Occupational History  . Not on file  Social Needs  . Financial resource strain: Not on file  . Food insecurity:    Worry: Not on file    Inability: Not on file  . Transportation needs:    Medical: Not on file    Non-medical: Not on file  Tobacco Use  . Smoking status: Former Smoker    Packs/day: 0.50     Years: 3.00    Pack years: 1.50    Types: Cigarettes    Last attempt to quit: 05/31/2009    Years since quitting: 9.4  . Smokeless tobacco: Never Used  Substance and Sexual Activity  . Alcohol use: No  . Drug use: No  . Sexual activity: Yes    Birth control/protection: None    Comment: Same sex partner  Lifestyle  . Physical activity:    Days per week: Not on file    Minutes per session: Not on file  . Stress: Not on file  Relationships  . Social connections:    Talks on phone: Not on file    Gets together: Not on file    Attends religious service: Not on file    Active member of club or organization: Not on file    Attends meetings of clubs or organizations: Not on file    Relationship status: Not on file  . Intimate partner violence:    Fear of current or ex partner: Not on file    Emotionally abused: Not on file    Physically abused: Not on file    Forced sexual activity: Not on file  Other Topics Concern  . Not on file  Social History Narrative   Marital status: single; dating same sexual partner casually.  Children: none.  Employment: Landscaping.  Tobacco: none  Alcohol: none Drugs: none.  Sexually active: yes.    Family History  Problem Relation Age of Onset  . Heart disease Maternal Grandmother   . Stroke Paternal Grandfather      Review of Systems  Constitutional: Negative.  Negative for chills and fever.  Respiratory: Negative.  Negative for shortness of breath.   Cardiovascular: Negative.  Negative for chest pain.  Gastrointestinal: Negative for abdominal pain, nausea and vomiting.  Skin: Negative.  Negative for rash.    Vitals:   11/09/18 1530  BP: 101/69  Pulse: 63  Resp: 18  Temp: 98.3 F (36.8 C)  SpO2: 98%    Physical Exam  Constitutional: She appears well-developed and well-nourished.  HENT:  Head: Normocephalic and atraumatic.  Eyes: Pupils are equal, round, and reactive to light. EOM are normal.  Neck: Normal range of motion. Neck  supple.  Cardiovascular: Normal rate.  Pulmonary/Chest: Effort normal. Right breast exhibits no inverted nipple, no mass, no nipple discharge, no skin change and no tenderness. Left breast exhibits no inverted nipple, no nipple discharge, no skin change and no tenderness.    Lymphadenopathy:       Right axillary: No pectoral and no lateral adenopathy present.       Left axillary: No pectoral and no lateral adenopathy present. Vitals reviewed.    ASSESSMENT & PLAN: Maryjo was seen today for breast mass.  Diagnoses  and all orders for this visit:  Breast mass in female -     MM Digital Diagnostic Bilat; Future -     [place] Unlisted Procedure Breast; Future    Patient Instructions       If you have lab work done today you will be contacted with your lab results within the next 2 weeks.  If you have not heard from [place] then please contact us. The fastest way to get your results is to register for My Chart.   IF you received an x-ray today, you will receive an invoice from [facility]. Please contact [facility] at [phone] with questions or concerns regarding your invoice.   IF you received labwork today, you will receive an invoice from [place]. Please contact [facility] at [phone] with questions or concerns regarding your invoice.   Our billing staff will not be able to assist you with questions regarding bills from these companies.  You will be contacted with the lab results as soon as they are available. The fastest way to get your results is to activate your My Chart account. Instructions are located on the last page of this paperwork. If you have not heard from [place] regarding the results in 2 weeks, please contact this office.     Mammogram A mammogram is an X-ray of the breasts that is done to check for changes that are not normal. This test can screen for and find any changes that may suggest breast cancer. This test can also help to find other changes  and variations in the breast. What happens before the procedure?  Have this test done about 1-2 weeks after your period. This is usually when your breasts are the least tender.  If you are visiting a new doctor or clinic, send any past mammogram images to your new doctor's office.  Wash your breasts and under your arms the day of the test.  Do not use deodorants, perfumes, lotions, or powders on the day of the test.  Take off any jewelry from your neck.  Wear clothes that you can change into and out of easily. What happens during the procedure?  You will undress from the waist up. You will put on a gown.  You will stand in front of the X-ray machine.  Each breast will be placed between two plastic or glass plates. The plates will press down on your breast for a few seconds. Try to stay as relaxed as possible. This does not cause any harm to your breasts. Any discomfort you feel will be very brief.  X-rays will be taken from different angles of each breast. The procedure may vary among doctors and hospitals. What happens after the procedure?  The mammogram will be looked at by a specialist (radiologist).  You may need to do certain parts of the test again. This depends on the quality of the images.  Ask when your test results will be ready. Make sure you get your test results.  You may go back to your normal activities. This information is not intended to replace advice given to you by your health care provider. Make sure you discuss any questions you have with your health care provider. Document Released: 02/13/2009 Document Revised: 04/24/2016 Document Reviewed: 01/26/2015 Elsevier Interactive Patient Education  2018 Elsevier Inc.      Edwina Barth, MD [facility]

## 2018-11-09 NOTE — Patient Instructions (Addendum)
     If you have lab work done today you will be contacted with your lab results within the next 2 weeks.  If you have not heard from us then please contact us. The fastest way to get your results is to register for My Chart.   IF you received an x-ray today, you will receive an invoice from [facility]. Please contact [facility] at [phone] with questions or concerns regarding your invoice.   IF you received labwork today, you will receive an invoice from [facility]. Please contact [facility] at [phone] with questions or concerns regarding your invoice.   Our billing staff will not be able to assist you with questions regarding bills from these companies.  You will be contacted with the lab results as soon as they are available. The fastest way to get your results is to activate your My Chart account. Instructions are located on the last page of this paperwork. If you have not heard from us regarding the results in 2 weeks, please contact this office.      Mammogram A mammogram is an X-ray of the breasts that is done to check for changes that are not normal. This test can screen for and find any changes that may suggest breast cancer. This test can also help to find other changes and variations in the breast. What happens before the procedure?  Have this test done about 1-2 weeks after your period. This is usually when your breasts are the least tender.  If you are visiting a new doctor or clinic, send any past mammogram images to your new doctor's office.  Wash your breasts and under your arms the day of the test.  Do not use deodorants, perfumes, lotions, or powders on the day of the test.  Take off any jewelry from your neck.  Wear clothes that you can change into and out of easily. What happens during the procedure?  You will undress from the waist up. You will put on a gown.  You will stand in front of the X-ray machine.  Each breast will be placed  between two plastic or glass plates. The plates will press down on your breast for a few seconds. Try to stay as relaxed as possible. This does not cause any harm to your breasts. Any discomfort you feel will be very brief.  X-rays will be taken from different angles of each breast. The procedure may vary among doctors and hospitals. What happens after the procedure?  The mammogram will be looked at by a specialist (radiologist).  You may need to do certain parts of the test again. This depends on the quality of the images.  Ask when your test results will be ready. Make sure you get your test results.  You may go back to your normal activities. This information is not intended to replace advice given to you by your health care provider. Make sure you discuss any questions you have with your health care provider. Document Released: 02/13/2009 Document Revised: 04/24/2016 Document Reviewed: 01/26/2015 Elsevier Interactive Patient Education  2018 Elsevier Inc.  

## 2018-11-17 ENCOUNTER — Other Ambulatory Visit: Payer: Self-pay | Admitting: Emergency Medicine

## 2018-11-17 DIAGNOSIS — N6320 Unspecified lump in the left breast, unspecified quadrant: Secondary | ICD-10-CM

## 2018-11-25 ENCOUNTER — Ambulatory Visit
Admission: RE | Admit: 2018-11-25 | Discharge: 2018-11-25 | Disposition: A | Discharge status: Home / Self Care | Payer: BC Managed Care – PPO | Source: Ambulatory Visit | Attending: Emergency Medicine | Admitting: Emergency Medicine

## 2018-11-25 DIAGNOSIS — N6320 Unspecified lump in the left breast, unspecified quadrant: Secondary | ICD-10-CM

## 2019-02-02 ENCOUNTER — Other Ambulatory Visit: Payer: Self-pay

## 2019-02-02 ENCOUNTER — Encounter: Payer: Self-pay | Admitting: Family Medicine

## 2019-02-02 ENCOUNTER — Ambulatory Visit: Payer: BC Managed Care – PPO | Admitting: Family Medicine

## 2019-02-02 VITALS — BP 98/63 | HR 71 | Temp 98.5°F | Resp 16 | Ht 68.0 in | Wt 173.0 lb

## 2019-02-02 DIAGNOSIS — J4520 Mild intermittent asthma, uncomplicated: Secondary | ICD-10-CM

## 2019-02-02 DIAGNOSIS — J069 Acute upper respiratory infection, unspecified: Secondary | ICD-10-CM

## 2019-02-02 DIAGNOSIS — J029 Acute pharyngitis, unspecified: Secondary | ICD-10-CM | POA: Diagnosis not present

## 2019-02-02 MED ORDER — PREDNISONE 20 MG PO TABS: 40.0000 mg | ORAL_TABLET | Freq: Every day | ORAL | 0 refills | Status: DC

## 2019-02-02 MED ORDER — ALBUTEROL SULFATE (2.5 MG/3ML) 0.083% IN NEBU: 2.5000 mg | INHALATION_SOLUTION | Freq: Four times a day (QID) | RESPIRATORY_TRACT | 0 refills | Status: DC | PRN

## 2019-02-02 MED ORDER — ALBUTEROL SULFATE HFA 108 (90 BASE) MCG/ACT IN AERS: 1.0000 | INHALATION_SPRAY | Freq: Four times a day (QID) | RESPIRATORY_TRACT | 0 refills | Status: AC | PRN

## 2019-02-02 NOTE — Progress Notes (Signed)
Subjective:    Patient ID: Victoria Larsen, female    DOB: 12-05-1985, 32 y.o.   MRN: [id number]  HPI Victoria Larsen is a 33 y.o. female Presents today for: Chief Complaint  Patient presents with  . Sore Throat    sorethroat, swollen lyphnodes,and sob Hx of asthma so its making it hard for me to breathe, patient stated used to have hx of strep but been much better since removed tonsiles 1997   Started about 5 days ago with sore throat, run down. Min congestion. Started to affect breathe - some cough and wheeze attacks. Using albuterol BID for now. Some echinacea, other supplements. Min cough.  Tylenol for body aches.  Neck glands initially felt swollen - better today.  No fever.  Heart fluttering a few times - resolves in few seconds. No prior heart issues.  No recent travel. 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 Some other staff out.  Still some body aches and rundown. Drinking fluids.   Asthma - mild intermittent. Has nebulizer if needed - once per year.  Prior to current illness, only needs albuterol every few months.     Patient Active Problem List   Diagnosis Date Noted  . Breast mass in female 11/09/2018  . Migraine 10/08/2018  . History of asthma 12/22/2017  . Influenza with respiratory manifestation 12/22/2017  . Flu-like symptoms 12/22/2017   Past Medical History:  Diagnosis Date  . Allergy   . Anemia   . Anxiety   . Asthma   . Chronic neck pain   . Fibromyalgia   . GERD (gastroesophageal reflux disease)   . Insomnia    Past Surgical History:  Procedure Laterality Date  . colonoscopy    . LAPAROSCOPY Right 09/08/2018   Procedure: LAPAROSCOPY DIAGNOSTIC/Possible Right Ovarian Cystectomy/Possible Ablation of Endometriosis;  Surgeon: Shea Evans, MD;  Location: [facility];  Service: Gynecology;  Laterality: Right;  . OVARIAN CYST SURGERY    . TONSILLECTOMY    . UPPER GI ENDOSCOPY    . wisdom teeth ext     Allergies  Allergen  Reactions  . Vicodin [Hydrocodone-Acetaminophen] Nausea And Vomiting  . Metronidazole   . Penicillins Rash    Infant Has patient had a PCN reaction causing immediate rash, facial/tongue/throat swelling, SOB or lightheadedness with hypotension: yes Has patient had a PCN reaction causing severe rash involving mucus membranes or skin necrosis: no Has patient had a PCN reaction that required hospitalization: no Has patient had a PCN reaction occurring within the last 10 years: no If all of the above answers are "NO", then may proceed with Cephalosporin use.    Prior to Admission medications   Medication Sig Start Date End Date Taking? Authorizing Provider  albuterol (PROVENTIL HFA;VENTOLIN HFA) 108 (90 BASE) MCG/ACT inhaler Inhale 2 puffs into the lungs every 6 (six) hours as needed for shortness of breath.    Yes [provider]  albuterol (PROVENTIL) (2.5 MG/3ML) 0.083% nebulizer solution Take 2.5 mg by nebulization every 6 (six) hours as needed for shortness of breath.    Yes [provider]  ASHWAGANDHA PO Take 1 capsule by mouth daily.   Yes [provider]  ibuprofen (ADVIL) 200 MG tablet Take 3 tablets (600 mg total) by mouth every 6 (six) hours as needed. 09/08/18  Yes Shea Evans, MD  ipratropium-albuterol (DUONEB) 0.5-2.5 (3) MG/3ML SOLN Take 3 mLs by nebulization every 6 (six) hours as needed. 06/13/12  Yes Daub, Maylon Peppers, MD  omeprazole (PRILOSEC) 40 MG capsule Take 40 mg by mouth daily.    Yes [provider]  OVER THE COUNTER MEDICATION Take 1 capsule by mouth daily as needed (FDGARD).   Yes [provider]  OVER THE COUNTER MEDICATION Take 1 capsule by mouth daily. Holy Basil   Yes [provider]  OVER THE COUNTER MEDICATION Take 1 capsule by mouth daily. 5-Hydroxytryptophan (5-HTP)   Yes [provider]  Peppermint Oil (IBGARD) 90 MG CPCR Take 1 capsule by mouth daily as needed (IBS).   Yes [provider]    Social History   Socioeconomic History  . Marital status: Single    Spouse name: Not on file  . Number of children: Not on file  . Years of education: Not on file  . Highest education level: Not on file  Occupational History  . Not on file  Social Needs  . Financial resource strain: Not on file  . Food insecurity:    Worry: Not on file    Inability: Not on file  . Transportation needs:    Medical: Not on file    Non-medical: Not on file  Tobacco Use  . Smoking status: Former Smoker    Packs/day: 0.50    Years: 3.00    Pack years: 1.50    Types: Cigarettes    Last attempt to quit: 05/31/2009    Years since quitting: 9.6  . Smokeless tobacco: Never Used  Substance and Sexual Activity  . Alcohol use: No  . Drug use: No  . Sexual activity: Yes    Birth control/protection: None    Comment: Same sex partner  Lifestyle  . Physical activity:    Days per week: Not on file    Minutes per session: Not on file  . Stress: Not on file  Relationships  . Social connections:    Talks on phone: Not on file    Gets together: Not on file    Attends religious service: Not on file    Active member of club or organization: Not on file    Attends meetings of clubs or organizations: Not on file    Relationship status: Not on file  . Intimate partner violence:    Fear of current or ex partner: Not on file    Emotionally abused: Not on file    Physically abused: Not on file    Forced sexual activity: Not on file  Other Topics Concern  . Not on file  Social History Narrative   Marital status: single; dating same sexual partner casually.  Children: none.  Employment: Landscaping.  Tobacco: none  Alcohol: none Drugs: none.  Sexually active: yes.      Review of Systems Per HPI.     Objective:   Physical Exam Vitals signs reviewed.  Constitutional:      General: She is not in acute distress.    Appearance: She is well-developed.  HENT:     Head: Normocephalic and atraumatic.      Right Ear: Hearing, tympanic membrane, ear canal and external ear normal.     Left Ear: Hearing, tympanic membrane, ear canal and external ear normal.     Nose: Nose normal. No rhinorrhea.     Mouth/Throat:     Mouth: Mucous membranes are moist.     Pharynx: No oropharyngeal exudate or posterior oropharyngeal erythema.  Eyes:     Conjunctiva/sclera: Conjunctivae normal.     Pupils: Pupils are equal, round, and reactive to  light.  Cardiovascular:     Rate and Rhythm: Normal rate and regular rhythm.     Heart sounds: Normal heart sounds. No murmur.  Pulmonary:     Effort: Pulmonary effort is normal. No respiratory distress.     Breath sounds: Normal breath sounds. No wheezing or rhonchi.  Lymphadenopathy:     Cervical: Cervical adenopathy present.     Left cervical: Posterior cervical adenopathy (min enlarged, ttp. no other appreciable LAD. ) present.  Skin:    General: Skin is warm and dry.     Findings: No rash.  Neurological:     Mental Status: She is alert and oriented to person, place, and time.  Psychiatric:        Behavior: Behavior normal.    Vitals:   02/02/19 1657  BP: 98/63  Pulse: 71  Resp: 16  Temp: 98.5 F (36.9 C)  TempSrc: Oral  SpO2: 98%  Weight: 173 lb (78.5 kg)  Height:  (1.727 m)       Assessment & Plan:    Victoria Larsen is a 32 y.o. female Acute upper respiratory infection  Sorethroat  Mild intermittent asthma, unspecified whether complicated - Plan: albuterol (PROVENTIL HFA;VENTOLIN HFA) 108 (90 Base) MCG/ACT inhaler, albuterol (PROVENTIL) (2.5 MG/3ML) 0.083% nebulizer solution, DISCONTINUED: predniSONE (DELTASONE) 20 MG tablet  Suspected viral illness with flare of asthma.  Clear in office, but option of prednisone if frequent need for albuterol.  Potential side effects and risk of prednisone discussed.  Has neb at home if needed.  RTC/ER precautions given.  Meds ordered this encounter  Medications  . albuterol (PROVENTIL  HFA;VENTOLIN HFA) 108 (90 Base) MCG/ACT inhaler    Sig: Inhale 1-2 puffs into the lungs every 6 (six) hours as needed for shortness of breath.    Dispense:  1 Inhaler    Refill:  0  . albuterol (PROVENTIL) (2.5 MG/3ML) 0.083% nebulizer solution    Sig: Take 3 mLs (2.5 mg total) by nebulization every 6 (six) hours as needed for shortness of breath.    Dispense:  75 mL    Refill:  0  . DISCONTD: predniSONE (DELTASONE) 20 MG tablet    Sig: Take 2 tablets (40 mg total) by mouth daily with breakfast.    Dispense:  6 tablet    Refill:  0   Patient Instructions    Current symptoms appear to be due to a virus.  Some of your respiratory symptoms are likely due to flare of asthma but lungs are clear in the office today.  Okay to use albuterol every 4-6 hours as needed, albuterol nebulizer only if needed for more severe symptoms.  If persistent need or more frequent need of albuterol I did prescribe prednisone.  If any fevers or worsening symptoms, please return for recheck.   Return to the clinic or go to the nearest emergency room if any of your symptoms worsen or new symptoms occur.    Asthma, Adult  Asthma is a long-term (chronic) condition that causes recurrent episodes in which the airways become tight and narrow. The airways are the passages that lead from the nose and mouth down into the lungs. Asthma episodes, also called asthma attacks, can cause coughing, wheezing, shortness of breath, and chest pain. The airways can also fill with mucus. During an attack, it can be difficult to breathe. Asthma attacks can range from minor to life threatening. Asthma cannot be cured, but medicines and lifestyle changes can help control it and treat  acute attacks. What are the causes? This condition is believed to be caused by inherited (genetic) and environmental factors, but its exact cause is not known. There are many things that can bring on an asthma attack or make asthma symptoms worse (triggers).  Asthma triggers are different for each person. Common triggers include:  Mold.  Dust.  Cigarette smoke.  Cockroaches.  Things that can cause allergy symptoms (allergens), such as animal dander or pollen from trees or grass.  Air pollutants such as household cleaners, wood smoke, smog, or Therapist, occupational.  Cold air, weather changes, and winds (which increase molds and pollen in the air).  Strong emotional expressions such as crying or laughing hard.  Stress.  Certain medicines (such as aspirin) or types of medicines (such as beta-blockers).  Sulfites in foods and drinks. Foods and drinks that may contain sulfites include dried fruit, potato chips, and sparkling grape juice.  Infections or inflammatory conditions such as the flu, a cold, or inflammation of the nasal membranes (rhinitis).  Gastroesophageal reflux disease (GERD).  Exercise or strenuous activity. What are the signs or symptoms? Symptoms of this condition may occur right after asthma is triggered or many hours later. Symptoms include:  Wheezing. This can sound like whistling when you breathe.  Excessive nighttime or early morning coughing.  Frequent or severe coughing with a common cold.  Chest tightness.  Shortness of breath.  Tiredness (fatigue) with minimal activity. How is this diagnosed? This condition is diagnosed based on:  Your medical history.  A physical exam.  Tests, which may include: ? Lung function studies and pulmonary studies (spirometry). These tests can evaluate the flow of air in your lungs. ? Allergy tests. ? Imaging tests, such as X-rays. How is this treated? There is no cure for this condition, but treatment can help control your symptoms. Treatment for asthma usually involves:  Identifying and avoiding your asthma triggers.  Using medicines to control your symptoms. Generally, two types of medicines are used to treat asthma: ? Controller medicines. These help prevent asthma  symptoms from occurring. They are usually taken every day. ? Fast-acting reliever or rescue medicines. These quickly relieve asthma symptoms by widening the narrow and tight airways. They are used as needed and provide short-term relief.  Using supplemental oxygen. This may be needed during a severe episode.  Using other medicines, such as: ? Allergy medicines, such as antihistamines, if your asthma attacks are triggered by allergens. ? Immune medicines (immunomodulators). These are medicines that help control the immune system.  Creating an asthma action plan. An asthma action plan is a written plan for managing and treating your asthma attacks. This plan includes: ? A list of your asthma triggers and how to avoid them. ? Information about when medicines should be taken and when their dosage should be changed. ? Instructions about using a device called a peak flow meter. A peak flow meter measures how well the lungs are working and the severity of your asthma. It helps you monitor your condition. Follow these instructions at home: Controlling your home environment Control your home environment in the following ways to help avoid triggers and prevent asthma attacks:  Change your heating and air conditioning filter regularly.  Limit your use of fireplaces and wood stoves.  Get rid of pests (such as roaches and mice) and their droppings.  Throw away plants if you see mold on them.  Clean floors and dust surfaces regularly. Use unscented cleaning products.  Try to have someone  else vacuum for you regularly. Stay out of rooms while they are being vacuumed and for a short while afterward. If you vacuum, use a dust mask from a hardware store, a double-layered or microfilter vacuum cleaner bag, or a vacuum cleaner with a HEPA filter.  Replace carpet with wood, tile, or vinyl flooring. Carpet can trap dander and dust.  Use allergy-proof pillows, mattress covers, and box spring covers.  Keep  your bedroom a trigger-free room.  Avoid pets and keep windows closed when allergens are in the air.  Wash beddings every week in hot water and dry them in a dryer.  Use blankets that are made of polyester or cotton.  Clean bathrooms and kitchens with bleach. If possible, have someone repaint the walls in these rooms with mold-resistant paint. Stay out of the rooms that are being cleaned and painted.  Wash your hands often with soap and water. If soap and water are not available, use hand sanitizer.  Do not allow anyone to smoke in your home. General instructions  Take over-the-counter and prescription medicines only as told by your health care provider. ? Speak with your health care provider if you have questions about how or when to take the medicines. ? Make note if you are requiring more frequent dosages.  Do not use any products that contain nicotine or tobacco, such as cigarettes and e-cigarettes. If you need help quitting, ask your health care provider. Also, avoid being exposed to secondhand smoke.  Use a peak flow meter as told by your health care provider. Record and keep track of the readings.  Understand and use the asthma action plan to help minimize, or stop an asthma attack, without needing to seek medical care.  Make sure you stay up to date on your yearly vaccinations as told by your health care provider. This may include vaccines for the flu and pneumonia.  Avoid outdoor activities when allergen counts are high and when air quality is low.  Wear a ski mask that covers your nose and mouth during outdoor winter activities. Exercise indoors on cold days if you can.  Warm up before exercising, and take time for a cool-down period after exercise.  Keep all follow-up visits as told by your health care provider. This is important. Where to find more information  For information about asthma, turn to the Centers for Disease Control and Prevention at  http://www.mills-berg.com/.htm  For air quality information, turn to [employer] at [employer] Contact a health care provider if:  You have wheezing, shortness of breath, or a cough even while you are taking medicine to prevent attacks.  The mucus you cough up (sputum) is thicker than usual.  Your sputum changes from clear or white to yellow, green, gray, or bloody.  Your medicines are causing side effects, such as a rash, itching, swelling, or trouble breathing.  You need to use a reliever medicine more than 2-3 times a week.  Your peak flow reading is still at 50-79% of your personal best after following your action plan for 1 hour.  You have a fever. Get help right away if:  You are getting worse and do not respond to treatment during an asthma attack.  You are short of breath when at rest or when doing very little physical activity.  You have difficulty eating, drinking, or talking.  You have chest pain or tightness.  You develop a fast heartbeat or palpitations.  You have a bluish color to your lips or fingernails.  You are light-headed or dizzy, or you faint.  Your peak flow reading is less than 50% of your personal best.  You feel too tired to breathe normally. Summary  Asthma is a long-term (chronic) condition that causes recurrent episodes in which the airways become tight and narrow. These episodes can cause coughing, wheezing, shortness of breath, and chest pain.  Asthma cannot be cured, but medicines and lifestyle changes can help control it and treat acute attacks.  Make sure you understand how to avoid triggers and how and when to use your medicines.  Asthma attacks can range from minor to life threatening. Get help right away if you have an asthma attack and do not respond to treatment with your usual rescue medicines. This information is not intended to replace advice given to you by your health care provider. Make sure you discuss any questions you have  with your health care provider. Document Released: 11/17/2005 Document Revised: 12/22/2016 Document Reviewed: 12/22/2016 Elsevier Interactive Patient Education  2019 Elsevier Inc.   Upper Respiratory Infection, Adult An upper respiratory infection (URI) affects the nose, throat, and upper air passages. URIs are caused by germs (viruses). The most common type of URI is often called "the common cold." Medicines cannot cure URIs, but you can do things at home to relieve your symptoms. URIs usually get better within 7-10 days. Follow these instructions at home: Activity  Rest as needed.  If you have a fever, stay home from work or school until your fever is gone, or until your doctor says you may return to work or school. ? You should stay home until you cannot spread the infection anymore (you are not contagious). ? Your doctor may have you wear a face mask so you have less risk of spreading the infection. Relieving symptoms  Gargle with a salt-water mixture 3-4 times a day or as needed. To make a salt-water mixture, completely dissolve -1 tsp of salt in 1 cup of warm water.  Use a cool-mist humidifier to add moisture to the air. This can help you breathe more easily. Eating and drinking   Drink enough fluid to keep your pee (urine) pale yellow.  Eat soups and other clear broths. General instructions   Take over-the-counter and prescription medicines only as told by your doctor. These include cold medicines, fever reducers, and cough suppressants.  Do not use any products that contain nicotine or tobacco. These include cigarettes and e-cigarettes. If you need help quitting, ask your doctor.  Avoid being where people are smoking (avoid secondhand smoke).  Make sure you get regular shots and get the flu shot every year.  Keep all follow-up visits as told by your doctor. This is important. How to avoid spreading infection to others   Wash your hands often with soap and water. If  you do not have soap and water, use hand sanitizer.  Avoid touching your mouth, face, eyes, or nose.  Cough or sneeze into a tissue or your sleeve or elbow. Do not cough or sneeze into your hand or into the air. Contact a doctor if:  You are getting worse, not better.  You have any of these: ? A fever. ? Chills. ? Brown or red mucus in your nose. ? Yellow or brown fluid (discharge)coming from your nose. ? Pain in your face, especially when you bend forward. ? Swollen neck glands. ? Pain with swallowing. ? White areas in the back of your throat. Get help right away if:  You have  shortness of breath that gets worse.  You have very bad or constant: ? Headache. ? Ear pain. ? Pain in your forehead, behind your eyes, and over your cheekbones (sinus pain). ? Chest pain.  You have long-lasting (chronic) lung disease along with any of these: ? Wheezing. ? Long-lasting cough. ? Coughing up blood. ? A change in your usual mucus.  You have a stiff neck.  You have changes in your: ? Vision. ? Hearing. ? Thinking. ? Mood. Summary  An upper respiratory infection (URI) is caused by a germ called a virus. The most common type of URI is often called "the common cold."  URIs usually get better within 7-10 days.  Take over-the-counter and prescription medicines only as told by your doctor. This information is not intended to replace advice given to you by your health care provider. Make sure you discuss any questions you have with your health care provider. Document Released: 05/05/2008 Document Revised: 07/10/2017 Document Reviewed: 07/10/2017 Elsevier Interactive Patient Education  [employer].    If you have lab work done today you will be contacted with your lab results within the next 2 weeks.  If you have not heard from [place] then please contact us. The fastest way to get your results is to register for My Chart.   IF you received an x-ray today, you will receive an  invoice from [facility]. Please contact [facility] at [phone] with questions or concerns regarding your invoice.   IF you received labwork today, you will receive an invoice from [facility]. Please contact [facility] at [phone] with questions or concerns regarding your invoice.   Our billing staff will not be able to assist you with questions regarding bills from these companies.  You will be contacted with the lab results as soon as they are available. The fastest way to get your results is to activate your My Chart account. Instructions are located on the last page of this paperwork. If you have not heard from [place] regarding the results in 2 weeks, please contact this office.       Signed,   Meredith Staggers, MD [facility] at [facility].  02/05/19 1:08 PM

## 2019-02-02 NOTE — Patient Instructions (Addendum)
Current symptoms appear to be due to a virus.  Some of your respiratory symptoms are likely due to flare of asthma but lungs are clear in the office today.  Okay to use albuterol every 4-6 hours as needed, albuterol nebulizer only if needed for more severe symptoms.  If persistent need or more frequent need of albuterol I did prescribe prednisone.  If any fevers or worsening symptoms, please return for recheck.   Return to the clinic or go to the nearest emergency room if any of your symptoms worsen or new symptoms occur.    Asthma, Adult  Asthma is a long-term (chronic) condition that causes recurrent episodes in which the airways become tight and narrow. The airways are the passages that lead from the nose and mouth down into the lungs. Asthma episodes, also called asthma attacks, can cause coughing, wheezing, shortness of breath, and chest pain. The airways can also fill with mucus. During an attack, it can be difficult to breathe. Asthma attacks can range from minor to life threatening. Asthma cannot be cured, but medicines and lifestyle changes can help control it and treat acute attacks. What are the causes? This condition is believed to be caused by inherited (genetic) and environmental factors, but its exact cause is not known. There are many things that can bring on an asthma attack or make asthma symptoms worse (triggers). Asthma triggers are different for each person. Common triggers include:  Mold.  Dust.  Cigarette smoke.  Cockroaches.  Things that can cause allergy symptoms (allergens), such as animal dander or pollen from trees or grass.  Air pollutants such as household cleaners, wood smoke, smog, or Therapist, occupational.  Cold air, weather changes, and winds (which increase molds and pollen in the air).  Strong emotional expressions such as crying or laughing hard.  Stress.  Certain medicines (such as aspirin) or types of medicines (such as beta-blockers).  Sulfites  in foods and drinks. Foods and drinks that may contain sulfites include dried fruit, potato chips, and sparkling grape juice.  Infections or inflammatory conditions such as the flu, a cold, or inflammation of the nasal membranes (rhinitis).  Gastroesophageal reflux disease (GERD).  Exercise or strenuous activity. What are the signs or symptoms? Symptoms of this condition may occur right after asthma is triggered or many hours later. Symptoms include:  Wheezing. This can sound like whistling when you breathe.  Excessive nighttime or early morning coughing.  Frequent or severe coughing with a common cold.  Chest tightness.  Shortness of breath.  Tiredness (fatigue) with minimal activity. How is this diagnosed? This condition is diagnosed based on:  Your medical history.  A physical exam.  Tests, which may include: ? Lung function studies and pulmonary studies (spirometry). These tests can evaluate the flow of air in your lungs. ? Allergy tests. ? Imaging tests, such as X-rays. How is this treated? There is no cure for this condition, but treatment can help control your symptoms. Treatment for asthma usually involves:  Identifying and avoiding your asthma triggers.  Using medicines to control your symptoms. Generally, two types of medicines are used to treat asthma: ? Controller medicines. These help prevent asthma symptoms from occurring. They are usually taken every day. ? Fast-acting reliever or rescue medicines. These quickly relieve asthma symptoms by widening the narrow and tight airways. They are used as needed and provide short-term relief.  Using supplemental oxygen. This may be needed during a severe episode.  Using other medicines, such as: ? Allergy  medicines, such as antihistamines, if your asthma attacks are triggered by allergens. ? Immune medicines (immunomodulators). These are medicines that help control the immune system.  Creating an asthma action plan.  An asthma action plan is a written plan for managing and treating your asthma attacks. This plan includes: ? A list of your asthma triggers and how to avoid them. ? Information about when medicines should be taken and when their dosage should be changed. ? Instructions about using a device called a peak flow meter. A peak flow meter measures how well the lungs are working and the severity of your asthma. It helps you monitor your condition. Follow these instructions at home: Controlling your home environment Control your home environment in the following ways to help avoid triggers and prevent asthma attacks:  Change your heating and air conditioning filter regularly.  Limit your use of fireplaces and wood stoves.  Get rid of pests (such as roaches and mice) and their droppings.  Throw away plants if you see mold on them.  Clean floors and dust surfaces regularly. Use unscented cleaning products.  Try to have someone else vacuum for you regularly. Stay out of rooms while they are being vacuumed and for a short while afterward. If you vacuum, use a dust mask from a hardware store, a double-layered or microfilter vacuum cleaner bag, or a vacuum cleaner with a HEPA filter.  Replace carpet with wood, tile, or vinyl flooring. Carpet can trap dander and dust.  Use allergy-proof pillows, mattress covers, and box spring covers.  Keep your bedroom a trigger-free room.  Avoid pets and keep windows closed when allergens are in the air.  Wash beddings every week in hot water and dry them in a dryer.  Use blankets that are made of polyester or cotton.  Clean bathrooms and kitchens with bleach. If possible, have someone repaint the walls in these rooms with mold-resistant paint. Stay out of the rooms that are being cleaned and painted.  Wash your hands often with soap and water. If soap and water are not available, use hand sanitizer.  Do not allow anyone to smoke in your home. General  instructions  Take over-the-counter and prescription medicines only as told by your health care provider. ? Speak with your health care provider if you have questions about how or when to take the medicines. ? Make note if you are requiring more frequent dosages.  Do not use any products that contain nicotine or tobacco, such as cigarettes and e-cigarettes. If you need help quitting, ask your health care provider. Also, avoid being exposed to secondhand smoke.  Use a peak flow meter as told by your health care provider. Record and keep track of the readings.  Understand and use the asthma action plan to help minimize, or stop an asthma attack, without needing to seek medical care.  Make sure you stay up to date on your yearly vaccinations as told by your health care provider. This may include vaccines for the flu and pneumonia.  Avoid outdoor activities when allergen counts are high and when air quality is low.  Wear a ski mask that covers your nose and mouth during outdoor winter activities. Exercise indoors on cold days if you can.  Warm up before exercising, and take time for a cool-down period after exercise.  Keep all follow-up visits as told by your health care provider. This is important. Where to find more information  For information about asthma, turn to the Centers for Disease  Control and Prevention at http://www.mills-berg.com/.htm  For air quality information, turn to [employer] at [employer] Contact a health care provider if:  You have wheezing, shortness of breath, or a cough even while you are taking medicine to prevent attacks.  The mucus you cough up (sputum) is thicker than usual.  Your sputum changes from clear or white to yellow, green, gray, or bloody.  Your medicines are causing side effects, such as a rash, itching, swelling, or trouble breathing.  You need to use a reliever medicine more than 2-3 times a week.  Your peak flow reading is still at 50-79%  of your personal best after following your action plan for 1 hour.  You have a fever. Get help right away if:  You are getting worse and do not respond to treatment during an asthma attack.  You are short of breath when at rest or when doing very little physical activity.  You have difficulty eating, drinking, or talking.  You have chest pain or tightness.  You develop a fast heartbeat or palpitations.  You have a bluish color to your lips or fingernails.  You are light-headed or dizzy, or you faint.  Your peak flow reading is less than 50% of your personal best.  You feel too tired to breathe normally. Summary  Asthma is a long-term (chronic) condition that causes recurrent episodes in which the airways become tight and narrow. These episodes can cause coughing, wheezing, shortness of breath, and chest pain.  Asthma cannot be cured, but medicines and lifestyle changes can help control it and treat acute attacks.  Make sure you understand how to avoid triggers and how and when to use your medicines.  Asthma attacks can range from minor to life threatening. Get help right away if you have an asthma attack and do not respond to treatment with your usual rescue medicines. This information is not intended to replace advice given to you by your health care provider. Make sure you discuss any questions you have with your health care provider. Document Released: 11/17/2005 Document Revised: 12/22/2016 Document Reviewed: 12/22/2016 Elsevier Interactive Patient Education  2019 [employer]   Upper Respiratory Infection, Adult An upper respiratory infection (URI) affects the nose, throat, and upper air passages. URIs are caused by germs (viruses). The most common type of URI is often called "the common cold." Medicines cannot cure URIs, but you can do things at home to relieve your symptoms. URIs usually get better within 7-10 days. Follow these instructions at home: Activity  Rest  as needed.  If you have a fever, stay home from work or school until your fever is gone, or until your doctor says you may return to work or school. ? You should stay home until you cannot spread the infection anymore (you are not contagious). ? Your doctor may have you wear a face mask so you have less risk of spreading the infection. Relieving symptoms  Gargle with a salt-water mixture 3-4 times a day or as needed. To make a salt-water mixture, completely dissolve -1 tsp of salt in 1 cup of warm water.  Use a cool-mist humidifier to add moisture to the air. This can help you breathe more easily. Eating and drinking   Drink enough fluid to keep your pee (urine) pale yellow.  Eat soups and other clear broths. General instructions   Take over-the-counter and prescription medicines only as told by your doctor. These include cold medicines, fever reducers, and cough suppressants.  Do not use  any products that contain nicotine or tobacco. These include cigarettes and e-cigarettes. If you need help quitting, ask your doctor.  Avoid being where people are smoking (avoid secondhand smoke).  Make sure you get regular shots and get the flu shot every year.  Keep all follow-up visits as told by your doctor. This is important. How to avoid spreading infection to others   Wash your hands often with soap and water. If you do not have soap and water, use hand sanitizer.  Avoid touching your mouth, face, eyes, or nose.  Cough or sneeze into a tissue or your sleeve or elbow. Do not cough or sneeze into your hand or into the air. Contact a doctor if:  You are getting worse, not better.  You have any of these: ? A fever. ? Chills. ? Brown or red mucus in your nose. ? Yellow or brown fluid (discharge)coming from your nose. ? Pain in your face, especially when you bend forward. ? Swollen neck glands. ? Pain with swallowing. ? White areas in the back of your throat. Get help right away  if:  You have shortness of breath that gets worse.  You have very bad or constant: ? Headache. ? Ear pain. ? Pain in your forehead, behind your eyes, and over your cheekbones (sinus pain). ? Chest pain.  You have long-lasting (chronic) lung disease along with any of these: ? Wheezing. ? Long-lasting cough. ? Coughing up blood. ? A change in your usual mucus.  You have a stiff neck.  You have changes in your: ? Vision. ? Hearing. ? Thinking. ? Mood. Summary  An upper respiratory infection (URI) is caused by a germ called a virus. The most common type of URI is often called "the common cold."  URIs usually get better within 7-10 days.  Take over-the-counter and prescription medicines only as told by your doctor. This information is not intended to replace advice given to you by your health care provider. Make sure you discuss any questions you have with your health care provider. Document Released: 05/05/2008 Document Revised: 07/10/2017 Document Reviewed: 07/10/2017 Elsevier Interactive Patient Education  [employer].    If you have lab work done today you will be contacted with your lab results within the next 2 weeks.  If you have not heard from [place] then please contact us. The fastest way to get your results is to register for My Chart.   IF you received an x-ray today, you will receive an invoice from [facility]. Please contact [facility] at [phone] with questions or concerns regarding your invoice.   IF you received labwork today, you will receive an invoice from [facility]. Please contact [facility] at [phone] with questions or concerns regarding your invoice.   Our billing staff will not be able to assist you with questions regarding bills from these companies.  You will be contacted with the lab results as soon as they are available. The fastest way to get your results is to activate your My Chart account. Instructions are located on  the last page of this paperwork. If you have not heard from [place] regarding the results in 2 weeks, please contact this office.

## 2019-02-05 ENCOUNTER — Other Ambulatory Visit: Payer: Self-pay

## 2019-02-05 ENCOUNTER — Ambulatory Visit (INDEPENDENT_AMBULATORY_CARE_PROVIDER_SITE_OTHER): Payer: BC Managed Care – PPO

## 2019-02-05 ENCOUNTER — Encounter: Payer: Self-pay | Admitting: Emergency Medicine

## 2019-02-05 ENCOUNTER — Ambulatory Visit: Payer: BC Managed Care – PPO | Admitting: Emergency Medicine

## 2019-02-05 VITALS — BP 102/68 | HR 72 | Temp 98.6°F | Resp 20 | Ht 68.0 in | Wt 166.6 lb

## 2019-02-05 DIAGNOSIS — R0600 Dyspnea, unspecified: Secondary | ICD-10-CM

## 2019-02-05 DIAGNOSIS — Z8709 Personal history of other diseases of the respiratory system: Secondary | ICD-10-CM | POA: Diagnosis not present

## 2019-02-05 DIAGNOSIS — B9789 Other viral agents as the cause of diseases classified elsewhere: Secondary | ICD-10-CM | POA: Diagnosis not present

## 2019-02-05 DIAGNOSIS — J988 Other specified respiratory disorders: Secondary | ICD-10-CM | POA: Diagnosis not present

## 2019-02-05 LAB — POC INFLUENZA A&B (BINAX/QUICKVUE)
Influenza A, POC: NEGATIVE
Influenza B, POC: NEGATIVE

## 2019-02-05 LAB — POCT CBC
GRANULOCYTE PERCENT: 61.3 % (ref 37–80)
HEMATOCRIT: 39 % (ref 29–41)
HEMOGLOBIN: 13.8 g/dL (ref 11–14.6)
Lymph, poc: 3 (ref 0.6–3.4)
MCH, POC: 32.5 pg — AB (ref 27–31.2)
MCHC: 35.4 g/dL (ref 31.8–35.4)
MCV: 91.9 fL (ref 76–111)
MID (cbc): 0.6 (ref 0–0.9)
MPV: 8.3 fL (ref 0–99.8)
POC GRANULOCYTE: 5.7 (ref 2–6.9)
POC LYMPH PERCENT: 32.3 %L (ref 10–50)
POC MID %: 6.4 %M (ref 0–12)
Platelet Count, POC: 244 10*3/uL (ref 142–424)
RBC: 4.24 M/uL (ref 4.04–5.48)
RDW, POC: 13.2 %
WBC: 9.3 10*3/uL (ref 4.6–10.2)

## 2019-02-05 MED ORDER — PREDNISONE 20 MG PO TABS: 40.0000 mg | ORAL_TABLET | Freq: Every day | ORAL | 0 refills | Status: AC

## 2019-02-05 NOTE — Patient Instructions (Addendum)
   If you have lab work done today you will be contacted with your lab results within the next 2 weeks.  If you have not heard from us then please contact us. The fastest way to get your results is to register for My Chart.   IF you received an x-ray today, you will receive an invoice from [facility]. Please contact [facility] at [phone] with questions or concerns regarding your invoice.   IF you received labwork today, you will receive an invoice from [facility]. Please contact [facility] at [phone] with questions or concerns regarding your invoice.   Our billing staff will not be able to assist you with questions regarding bills from these companies.  You will be contacted with the lab results as soon as they are available. The fastest way to get your results is to activate your My Chart account. Instructions are located on the last page of this paperwork. If you have not heard from us regarding the results in 2 weeks, please contact this office.     Viral Respiratory Infection A viral respiratory infection is an illness that affects parts of the body that are used for breathing. These include the lungs, nose, and throat. It is caused by a germ called a virus. Some examples of this kind of infection are:  A cold.  The flu (influenza).  A respiratory syncytial virus (RSV) infection. A person who gets this illness may have the following symptoms:  A stuffy or runny nose.  Yellow or green fluid in the nose.  A cough.  Sneezing.  Tiredness (fatigue).  Achy muscles.  A sore throat.  Sweating or chills.  A fever.  A headache. Follow these instructions at home: Managing pain and congestion  Take over-the-counter and prescription medicines only as told by your doctor.  If you have a sore throat, gargle with salt water. Do this 3-4 times per day or as needed. To make a salt-water mixture, dissolve -1 tsp of salt in 1 cup of warm water. Make  sure that all the salt dissolves.  Use nose drops made from salt water. This helps with stuffiness (congestion). It also helps soften the skin around your nose.  Drink enough fluid to keep your pee (urine) pale yellow. General instructions   Rest as much as possible.  Do not drink alcohol.  Do not use any products that have nicotine or tobacco, such as cigarettes and e-cigarettes. If you need help quitting, ask your doctor.  Keep all follow-up visits as told by your doctor. This is important. How is this prevented?   Get a flu shot every year. Ask your doctor when you should get your flu shot.  Do not let other people get your germs. If you are sick: ? Stay home from work or school. ? Wash your hands with soap and water often. Wash your hands after you cough or sneeze. If soap and water are not available, use hand sanitizer.  Avoid contact with people who are sick during cold and flu season. This is in fall and winter. Get help if:  Your symptoms last for 10 days or longer.  Your symptoms get worse over time.  You have a fever.  You have very bad pain in your face or forehead.  Parts of your jaw or neck become very swollen. Get help right away if:  You feel pain or pressure in your chest.  You have shortness of breath.  You faint or feel like you   will faint.  You keep throwing up (vomiting).  You feel confused. Summary  A viral respiratory infection is an illness that affects parts of the body that are used for breathing.  Examples of this illness include a cold, the flu, and respiratory syncytial virus (RSV) infection.  The infection can cause a runny nose, cough, sneezing, sore throat, and fever.  Follow what your doctor tells you about taking medicines, drinking lots of fluid, washing your hands, resting at home, and avoiding people who are sick. This information is not intended to replace advice given to you by your health care provider. Make sure you  discuss any questions you have with your health care provider. Document Released: 10/30/2008 Document Revised: 12/28/2017 Document Reviewed: 12/28/2017 Elsevier Interactive Patient Education  2019 [employer] Inc.  

## 2019-02-05 NOTE — Progress Notes (Signed)
Victoria Larsen 32 y.o.   Chief Complaint  Patient presents with  . Shortness of Breath    follow up came in weds and is still having trouble   . Cough    HISTORY OF PRESENT ILLNESS: This is a 33 y.o. female complaining of cough and shortness of breath for the past week.  Seen here last Wednesday and started on prednisone.  Patient has a history of asthma and has been using her nebulizer.  Started feeling sick on February 29, rundown and achy.  Next Monday, March 2 developed a sore throat with difficulty breathing and swollen lymph nodes.  Next day developed diffuse body aches, palpitations, difficulty breathing and sore throat.  Was seen here on March 4 when cough started with congestion and "heart working very hard", and very tired.  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  No known exposure with coronavirus patient.  HPI   Prior to Admission medications   Medication Sig Start Date End Date Taking? Authorizing Provider  albuterol (PROVENTIL HFA;VENTOLIN HFA) 108 (90 Base) MCG/ACT inhaler Inhale 1-2 puffs into the lungs every 6 (six) hours as needed for shortness of breath. 02/02/19  Yes Shade Flood, MD  albuterol (PROVENTIL) (2.5 MG/3ML) 0.083% nebulizer solution Take 3 mLs (2.5 mg total) by nebulization every 6 (six) hours as needed for shortness of breath. 02/02/19  Yes Shade Flood, MD  ASHWAGANDHA PO Take 1 capsule by mouth daily.   Yes [provider]  ibuprofen (ADVIL) 200 MG tablet Take 3 tablets (600 mg total) by mouth every 6 (six) hours as needed. 09/08/18  Yes Shea Evans, MD  ipratropium-albuterol (DUONEB) 0.5-2.5 (3) MG/3ML SOLN Take 3 mLs by nebulization every 6 (six) hours as needed. 06/13/12  Yes Collene Gobble, MD  omeprazole (PRILOSEC) 40 MG capsule Take 40 mg by mouth daily.    Yes [provider]  OVER THE COUNTER MEDICATION Take 1 capsule by mouth daily as needed (FDGARD).   Yes [provider]  OVER THE COUNTER  MEDICATION Take 1 capsule by mouth daily. Holy Basil   Yes [provider]  OVER THE COUNTER MEDICATION Take 1 capsule by mouth daily. 5-Hydroxytryptophan (5-HTP)   Yes [provider]  Peppermint Oil (IBGARD) 90 MG CPCR Take 1 capsule by mouth daily as needed (IBS).   Yes [provider]  predniSONE (DELTASONE) 20 MG tablet Take 2 tablets (40 mg total) by mouth daily with breakfast. 02/02/19  Yes Shade Flood, MD    Allergies  Allergen Reactions  . Vicodin [Hydrocodone-Acetaminophen] Nausea And Vomiting  . Metronidazole   . Penicillins Rash    Infant Has patient had a PCN reaction causing immediate rash, facial/tongue/throat swelling, SOB or lightheadedness with hypotension: yes Has patient had a PCN reaction causing severe rash involving mucus membranes or skin necrosis: no Has patient had a PCN reaction that required hospitalization: no Has patient had a PCN reaction occurring within the last 10 years: no If all of the above answers are "NO", then may proceed with Cephalosporin use.     Patient Active Problem List   Diagnosis Date Noted  . Breast mass in female 11/09/2018  . Migraine 10/08/2018  . History of asthma 12/22/2017  . Influenza with respiratory manifestation 12/22/2017  . Flu-like symptoms 12/22/2017    Past Medical History:  Diagnosis Date  . Allergy   . Anemia   . Anxiety   . Asthma   . Chronic neck pain   .  Fibromyalgia   . GERD (gastroesophageal reflux disease)   . Insomnia     Past Surgical History:  Procedure Laterality Date  . colonoscopy    . LAPAROSCOPY Right 09/08/2018   Procedure: LAPAROSCOPY DIAGNOSTIC/Possible Right Ovarian Cystectomy/Possible Ablation of Endometriosis;  Surgeon: Shea Evans, MD;  Location: [facility];  Service: Gynecology;  Laterality: Right;  . OVARIAN CYST SURGERY    . TONSILLECTOMY    . UPPER GI ENDOSCOPY    . wisdom teeth ext      Social History   Socioeconomic History  . Marital  status: Single    Spouse name: Not on file  . Number of children: Not on file  . Years of education: Not on file  . Highest education level: Not on file  Occupational History  . Not on file  Social Needs  . Financial resource strain: Not on file  . Food insecurity:    Worry: Not on file    Inability: Not on file  . Transportation needs:    Medical: Not on file    Non-medical: Not on file  Tobacco Use  . Smoking status: Former Smoker    Packs/day: 0.50    Years: 3.00    Pack years: 1.50    Types: Cigarettes    Last attempt to quit: 05/31/2009    Years since quitting: 9.6  . Smokeless tobacco: Never Used  Substance and Sexual Activity  . Alcohol use: No  . Drug use: No  . Sexual activity: Yes    Birth control/protection: None    Comment: Same sex partner  Lifestyle  . Physical activity:    Days per week: Not on file    Minutes per session: Not on file  . Stress: Not on file  Relationships  . Social connections:    Talks on phone: Not on file    Gets together: Not on file    Attends religious service: Not on file    Active member of club or organization: Not on file    Attends meetings of clubs or organizations: Not on file    Relationship status: Not on file  . Intimate partner violence:    Fear of current or ex partner: Not on file    Emotionally abused: Not on file    Physically abused: Not on file    Forced sexual activity: Not on file  Other Topics Concern  . Not on file  Social History Narrative   Marital status: single; dating same sexual partner casually.  Children: none.  Employment: Landscaping.  Tobacco: none  Alcohol: none Drugs: none.  Sexually active: yes.    Family History  Problem Relation Age of Onset  . Heart disease Maternal Grandmother   . Stroke Paternal Grandfather   . Breast cancer Maternal Aunt      Review of Systems  Constitutional: Positive for malaise/fatigue. Negative for chills and fever.  HENT: Positive for congestion and sore  throat.   Eyes: Negative.   Respiratory: Positive for cough, sputum production, shortness of breath and wheezing. Negative for hemoptysis.   Cardiovascular: Positive for palpitations. Negative for chest pain.  Gastrointestinal: Negative.  Negative for abdominal pain, diarrhea, nausea and vomiting.  Genitourinary: Negative.  Negative for dysuria and hematuria.  Musculoskeletal: Negative for back pain, myalgias and neck pain.  Skin: Negative for rash.  Neurological: Negative.  Negative for dizziness and headaches.  Endo/Heme/Allergies: Negative.     Vitals:   02/05/19 1014  BP: 102/68  Pulse: 72  Resp:  20  Temp: 98.6 F (37 C)  SpO2: 99%    Physical Exam Vitals signs reviewed.  Constitutional:      Appearance: Normal appearance.  HENT:     Head: Normocephalic and atraumatic.     Nose: Nose normal.     Mouth/Throat:     Mouth: Mucous membranes are moist.     Pharynx: Oropharynx is clear.  Eyes:     Extraocular Movements: Extraocular movements intact.     Conjunctiva/sclera: Conjunctivae normal.     Pupils: Pupils are equal, round, and reactive to light.  Neck:     Musculoskeletal: Normal range of motion and neck supple.  Cardiovascular:     Rate and Rhythm: Normal rate and regular rhythm.     Heart sounds: Normal heart sounds.  Pulmonary:     Effort: Pulmonary effort is normal.     Breath sounds: Normal breath sounds.  Abdominal:     Palpations: Abdomen is soft.     Tenderness: There is no abdominal tenderness.  Musculoskeletal: Normal range of motion.  Skin:    General: Skin is warm and dry.     Capillary Refill: Capillary refill takes less than 2 seconds.  Neurological:     General: No focal deficit present.     Mental Status: She is alert and oriented to person, place, and time.  Psychiatric:        Mood and Affect: Mood normal.        Behavior: Behavior normal.    Results for orders placed or performed in visit on 02/05/19 (from the past 24 hour(s))  POCT  CBC     Status: Abnormal   Collection Time: 02/05/19 10:36 AM  Result Value Ref Range   WBC 9.3 4.6 - 10.2 K/uL   Lymph, poc 3.0 0.6 - 3.4   POC LYMPH PERCENT 32.3 10 - 50 %L   MID (cbc) 0.6 0 - 0.9   POC MID % 6.4 0 - 12 %M   POC Granulocyte 5.7 2 - 6.9   Granulocyte percent 61.3 37 - 80 %G   RBC 4.24 4.04 - 5.48 M/uL   Hemoglobin 13.8 11 - 14.6 g/dL   HCT, POC 29.5 29 - 41 %   MCV 91.9 76 - 111 fL   MCH, POC 32.5 (A) 27 - 31.2 pg   MCHC 35.4 31.8 - 35.4 g/dL   RDW, POC 18.8 %   Platelet Count, POC 244 142 - 424 K/uL   MPV 8.3 0 - 99.8 fL  POC Influenza A&B(BINAX/QUICKVUE)     Status: None   Collection Time: 02/05/19 10:44 AM  Result Value Ref Range   Influenza A, POC Negative Negative   Influenza B, POC Negative Negative   Dg Chest 2 View  Result Date: 02/05/2019 CLINICAL DATA:  Dyspnea EXAM: CHEST - 2 VIEW COMPARISON:  06/13/2012 FINDINGS: The heart size and mediastinal contours are within normal limits. Both lungs are clear. The visualized skeletal structures are unremarkable. IMPRESSION: No active cardiopulmonary disease. Electronically Signed   By: Alcide Clever M.D.   On: 02/05/2019 10:54       ASSESSMENT & PLAN: Victoria Larsen was seen today for shortness of breath and cough.  Diagnoses and all orders for this visit:  Dyspnea, unspecified type -     POC Influenza A&B(BINAX/QUICKVUE) -     POCT CBC -     Comprehensive metabolic panel -     DG Chest 2 View; Future -     predniSONE (  DELTASONE) 20 MG tablet; Take 2 tablets (40 mg total) by mouth daily with breakfast for 5 days.  History of asthma -     predniSONE (DELTASONE) 20 MG tablet; Take 2 tablets (40 mg total) by mouth daily with breakfast for 5 days.  Viral respiratory infection    Patient Instructions       If you have lab work done today you will be contacted with your lab results within the next 2 weeks.  If you have not heard from [place] then please contact us. The fastest way to get your results is to  register for My Chart.   IF you received an x-ray today, you will receive an invoice from [facility]. Please contact [facility] at [phone] with questions or concerns regarding your invoice.   IF you received labwork today, you will receive an invoice from [place]. Please contact [facility] at [phone] with questions or concerns regarding your invoice.   Our billing staff will not be able to assist you with questions regarding bills from these companies.  You will be contacted with the lab results as soon as they are available. The fastest way to get your results is to activate your My Chart account. Instructions are located on the last page of this paperwork. If you have not heard from [place] regarding the results in 2 weeks, please contact this office.     Viral Respiratory Infection A viral respiratory infection is an illness that affects parts of the body that are used for breathing. These include the lungs, nose, and throat. It is caused by a germ called a virus. Some examples of this kind of infection are:  A cold.  The flu (influenza).  A respiratory syncytial virus (RSV) infection. A person who gets this illness may have the following symptoms:  A stuffy or runny nose.  Yellow or green fluid in the nose.  A cough.  Sneezing.  Tiredness (fatigue).  Achy muscles.  A sore throat.  Sweating or chills.  A fever.  A headache. Follow these instructions at home: Managing pain and congestion  Take over-the-counter and prescription medicines only as told by your doctor.  If you have a sore throat, gargle with salt water. Do this 3-4 times per day or as needed. To make a salt-water mixture, dissolve -1 tsp of salt in 1 cup of warm water. Make sure that all the salt dissolves.  Use nose drops made from salt water. This helps with stuffiness (congestion). It also helps soften the skin around your nose.  Drink enough fluid to keep your pee (urine)  pale yellow. General instructions   Rest as much as possible.  Do not drink alcohol.  Do not use any products that have nicotine or tobacco, such as cigarettes and e-cigarettes. If you need help quitting, ask your doctor.  Keep all follow-up visits as told by your doctor. This is important. How is this prevented?   Get a flu shot every year. Ask your doctor when you should get your flu shot.  Do not let other people get your germs. If you are sick: ? Stay home from work or school. ? Wash your hands with soap and water often. Wash your hands after you cough or sneeze. If soap and water are not available, use hand sanitizer.  Avoid contact with people who are sick during cold and flu season. This is in fall and winter. Get help if:  Your symptoms last for 10 days or longer.  Your symptoms get worse over time.  You have a fever.  You have very bad pain in your face or forehead.  Parts of your jaw or neck become very swollen. Get help right away if:  You feel pain or pressure in your chest.  You have shortness of breath.  You faint or feel like you will faint.  You keep throwing up (vomiting).  You feel confused. Summary  A viral respiratory infection is an illness that affects parts of the body that are used for breathing.  Examples of this illness include a cold, the flu, and respiratory syncytial virus (RSV) infection.  The infection can cause a runny nose, cough, sneezing, sore throat, and fever.  Follow what your doctor tells you about taking medicines, drinking lots of fluid, washing your hands, resting at home, and avoiding people who are sick. This information is not intended to replace advice given to you by your health care provider. Make sure you discuss any questions you have with your health care provider. Document Released: 10/30/2008 Document Revised: 12/28/2017 Document Reviewed: 12/28/2017 Elsevier Interactive Patient Education  2019 Elsevier  Inc.     Edwina Barth, MD [facility]

## 2019-02-06 LAB — COMPREHENSIVE METABOLIC PANEL
A/G RATIO: 2.1 (ref 1.2–2.2)
ALT: 22 IU/L (ref 0–32)
AST: 20 IU/L (ref 0–40)
Albumin: 4.6 g/dL (ref 3.9–5.0)
Alkaline Phosphatase: 46 IU/L (ref 39–117)
BILIRUBIN TOTAL: 0.4 mg/dL (ref 0.0–1.2)
BUN/Creatinine Ratio: 5 — ABNORMAL LOW (ref 9–23)
BUN: 4 mg/dL — ABNORMAL LOW (ref 6–20)
CHLORIDE: 110 mmol/L — AB (ref 96–106)
CO2: 23 mmol/L (ref 20–29)
Calcium: 9.4 mg/dL (ref 8.7–10.2)
Creatinine, Ser: 0.82 mg/dL (ref 0.57–1.00)
GFR, EST AFRICAN AMERICAN: 111 mL/min/{1.73_m2} (ref >59)
GFR, EST NON AFRICAN AMERICAN: 96 mL/min/{1.73_m2} (ref >59)
GLOBULIN, TOTAL: 2.2 g/dL (ref 1.5–4.5)
Glucose: 87 mg/dL (ref 65–99)
POTASSIUM: 3.7 mmol/L (ref 3.5–5.2)
Sodium: 147 mmol/L — ABNORMAL HIGH (ref 134–144)
TOTAL PROTEIN: 6.8 g/dL (ref 6.0–8.5)

## 2019-02-07 ENCOUNTER — Encounter: Payer: Self-pay | Admitting: Radiology

## 2019-02-07 ENCOUNTER — Ambulatory Visit: Payer: Self-pay | Admitting: Emergency Medicine

## 2019-02-07 NOTE — Telephone Encounter (Signed)
Pt called in c/o being very short of breath.   I could hear that she was short of breath while triaging her.   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   No travel history or exposure to coronavirus that she is aware of.  No fever.  See triage notes.  She is going to [facility] ED now.   She is only 5 minutes away and is going to drive herself so she does not expose anyone to what she has.   I let her know she could call 911 but since she was only 5 minutes away she opted to drive herself.    Reason for Disposition . [1] MODERATE difficulty breathing (e.g., speaks in phrases, SOB even at rest, pulse 100-120) AND [2] NEW-onset or WORSE than normal  Answer Assessment - Initial Assessment Questions 1. RESPIRATORY STATUS: "Describe your breathing?" (e.g., wheezing, shortness of breath, unable to speak, severe coughing)      I can hear she is very short of breath.   I've seen Dr. Alvy Bimler.   I have asthma.   I've seen the dr twice Monday and Wednesday.   I'm having trouble. breathing.   Coughing, sneezing, sore throat.   No fever. 2. ONSET: "When did this breathing problem begin?"      Saturday 29th I started feeling run down and by Monday it was full blown.   3. 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    Short of breath all the time now.   I'm doing nebulizer treatments and on prednisone.   Also using humidifier. 4. SEVERITY: "How bad is your breathing?" (e.g., mild, moderate, severe)    - MILD: No SOB at rest, mild SOB with walking, speaks normally in sentences, can lay down, no retractions, pulse < 100.    - MODERATE: SOB at rest, SOB with minimal exertion and prefers to sit, cannot lie down flat, speaks in phrases, mild retractions, audible wheezing, pulse 100-120.    - SEVERE: Very SOB at rest, speaks in single words, struggling to breathe, sitting hunched  forward, retractions, pulse > 120      Very short of breath even with cooking breakfast. 5. RECURRENT SYMPTOM: "Have you had difficulty breathing before?" If so, ask: "When was the last time?" and "What happened that time?"      Have asthma. 6. CARDIAC HISTORY: "Do you have any history of heart disease?" (e.g., heart attack, angina, bypass surgery, angioplasty)      No but I feel like my heart flutters intermittently. 7. LUNG HISTORY: "Do you have any history of lung disease?"  (e.g., pulmonary embolus, asthma, emphysema)     Asthma. 8. CAUSE: "What do you think is causing the breathing problem?"      Asthma and exposure to children. 9. OTHER SYMPTOMS: "Do you have any other symptoms? (e.g., dizziness, runny nose, cough, chest pain, fever)     Runny nose, coughing mostly a dry cough. 10. PREGNANCY: "Is there any chance you are pregnant?" "When was your last menstrual period?"       No 11. TRAVEL: "Have you traveled out of the country in the last month?" (e.g., travel history, exposures)       No travel  Protocols used: BREATHING DIFFICULTY-A-AH

## 2019-02-09 ENCOUNTER — Encounter: Payer: Self-pay | Admitting: Family Medicine

## 2019-02-09 ENCOUNTER — Encounter (HOSPITAL_COMMUNITY): Payer: Self-pay

## 2019-02-09 ENCOUNTER — Emergency Department (HOSPITAL_COMMUNITY): Payer: BC Managed Care – PPO

## 2019-02-09 ENCOUNTER — Other Ambulatory Visit: Payer: Self-pay

## 2019-02-09 ENCOUNTER — Ambulatory Visit: Payer: BC Managed Care – PPO | Admitting: Family Medicine

## 2019-02-09 VITALS — BP 112/73 | HR 80 | Temp 98.5°F | Resp 14 | Ht 69.0 in | Wt 169.4 lb

## 2019-02-09 DIAGNOSIS — R110 Nausea: Secondary | ICD-10-CM | POA: Insufficient documentation

## 2019-02-09 DIAGNOSIS — E870 Hyperosmolality and hypernatremia: Secondary | ICD-10-CM

## 2019-02-09 DIAGNOSIS — R0602 Shortness of breath: Secondary | ICD-10-CM | POA: Insufficient documentation

## 2019-02-09 DIAGNOSIS — R5383 Other fatigue: Secondary | ICD-10-CM | POA: Diagnosis not present

## 2019-02-09 DIAGNOSIS — R197 Diarrhea, unspecified: Secondary | ICD-10-CM

## 2019-02-09 DIAGNOSIS — R1084 Generalized abdominal pain: Secondary | ICD-10-CM | POA: Diagnosis not present

## 2019-02-09 DIAGNOSIS — M545 Low back pain: Secondary | ICD-10-CM | POA: Insufficient documentation

## 2019-02-09 DIAGNOSIS — Z5321 Procedure and treatment not carried out due to patient leaving prior to being seen by health care provider: Secondary | ICD-10-CM | POA: Insufficient documentation

## 2019-02-09 DIAGNOSIS — R05 Cough: Secondary | ICD-10-CM | POA: Diagnosis not present

## 2019-02-09 DIAGNOSIS — R531 Weakness: Secondary | ICD-10-CM

## 2019-02-09 DIAGNOSIS — R002 Palpitations: Secondary | ICD-10-CM

## 2019-02-09 DIAGNOSIS — R1031 Right lower quadrant pain: Secondary | ICD-10-CM

## 2019-02-09 DIAGNOSIS — R0600 Dyspnea, unspecified: Secondary | ICD-10-CM

## 2019-02-09 DIAGNOSIS — B349 Viral infection, unspecified: Secondary | ICD-10-CM | POA: Insufficient documentation

## 2019-02-09 DIAGNOSIS — J45901 Unspecified asthma with (acute) exacerbation: Secondary | ICD-10-CM

## 2019-02-09 NOTE — Patient Instructions (Addendum)
Lungs were clear on exam today, vital signs are reassuring, but given your current symptoms, and some worsening symptoms during this illness, I do recommend further evaluation through the emergency room to determine next step in testing.   Please proceed there after leaving our office.  I would recommend continuing to wear facemask until evaluated through emergency room.   I will review the paperwork for her employer.  Okay to remain out of work until Monday, but we can extend that if needed.  Please follow-up after emergency room visit if needed for further testing/follow-up.   If you have lab work done today you will be contacted with your lab results within the next 2 weeks.  If you have not heard from [place] then please contact us. The fastest way to get your results is to register for My Chart.   IF you received an x-ray today, you will receive an invoice from [facility]. Please contact [facility] at [phone] with questions or concerns regarding your invoice.   IF you received labwork today, you will receive an invoice from [place]. Please contact [facility] at [phone] with questions or concerns regarding your invoice.   Our billing staff will not be able to assist you with questions regarding bills from these companies.  You will be contacted with the lab results as soon as they are available. The fastest way to get your results is to activate your My Chart account. Instructions are located on the last page of this paperwork. If you have not heard from [place] regarding the results in 2 weeks, please contact this office.

## 2019-02-09 NOTE — ED Triage Notes (Addendum)
Pt coming from PCP after c/o shortness of breath since 01/29/19 with no relief from neb treatment and prednisone. Dry cough. 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 PCP concerned for corona virus. Currently c/o lower back as well. Tested neg for flu at pcp  N and D but no V No fever

## 2019-02-09 NOTE — ED Notes (Signed)
Pt back in lobby after chest xray.

## 2019-02-09 NOTE — Progress Notes (Signed)
Subjective:    Patient ID: Victoria Larsen, female    DOB: [birth year], 33 y.o.   MRN: [id number]  HPI Victoria Larsen is a 33 y.o. female Presents today for: Chief Complaint  Patient presents with   Fatigue    Abd pain, diarrhea, cough, sorethroat, bodyache, eating makes everything worse this all been going on since2/29/20. Has been seeen here for the same symptom 02/02/19   Here with complaints as above. I saw her on March 4, at that time thought to have a viral illness with a flare of her asthma.  Continue albuterol with option of prednisone if frequent need for albuterol.  Seen on March 7, cough and shortness of breath for the prior week.  Had been using her nebulizer, did start prednisone.  Sore throat, swollen lymph nodes reported at that visit, denied recent travel or exposure to coronavirus patients.  She had a chest x-ray without any active cardiopulmonary disease.  Negative flu testing and normal CBC at that time.  Repeat prednisone prescription 40 mg daily x5 days. Phone note March 9, increased dyspnea, plan for emergency room eval. She took prednisone, and calmed down breathing and neb helped - did not go to emergency room.  No known exposure to coronavirus patient or travel.  Today is day 11 of symptoms.  No measured fever, tmax 99.4 yesterday.  "feels it moving through her body" - brings 2 page document on symptoms: March 8, prednisone was helping breathing, diarrhea started to intensify, loose stools abnormal, feels like eating worsening symptoms.  March night started to feel a bit better, breathing responded well to treatments at night has flareup, bad breathing, diarrhea.  March 10 fever of 99.4, diarrhea, low back pain, leg weakness, watery eyes, hard to breathe.  Feels exhausted, shaking at times.  Symptoms up and down.  Paper indicates she would like to recheck her BUN and creatinine, a respiratory blood panel, EKG, chest x-ray.  Worried about kidney/liver function.   Has been using acetaminophen.  Asking about coronavirus testing.  Since last ov: Up and down - feeling better at times. Breathing worsens, wheezing. Worse exhaustion. Worse than mono.  Hard to talk or go to different room at times- exhausted and short of breath.  Using albuterol neb 3 times per day, breathing better. Still on prednisone (has one more day - initial 3 days of 4mg , then 5 days of 40mg  starting on 3/8).  Heart feels like it is working hard - beating fast, fluttering at times - 1-2 times per day. No chest pains.  Diarrhea - started initially with illness - loose stools, but hx of IBS. Worsening around 3/8 - diarrhea 8-9 times per day, loose - not watery. Occasional blood in stool - not everytime.  Started with low back pain yesterday. Concerned about low BUN (4 but nl creatinine on 3/7).  Has been on home isolation since March 3rd.  Feels overall weak - limbs feel cramped, not strong at times. No focal weakness. Feeling shaky, no seizures.  Na 147.  Called health dept 4-5 days ago - recommended respiratory panel?   Wondering about hospitalization.  Eating some, drinking fluids. Feels like drinking sufficient fluids.   Results for orders placed or performed in visit on 02/05/19  Comprehensive metabolic panel  Result Value Ref Range   Glucose 87 65 - 99 mg/dL   BUN 4 (L) 6 - 20 mg/dL   Creatinine, Ser 3.33 0.57 - 1.00 mg/dL   GFR calc non Af  Amer 96 >59 mL/min/1.73   GFR calc Af Amer 111 >59 mL/min/1.73   BUN/Creatinine Ratio 5 (L) 9 - 23   Sodium 147 (H) 134 - 144 mmol/L   Potassium 3.7 3.5 - 5.2 mmol/L   Chloride 110 (H) 96 - 106 mmol/L   CO2 23 20 - 29 mmol/L   Calcium 9.4 8.7 - 10.2 mg/dL   Total Protein 6.8 6.0 - 8.5 g/dL   Albumin 4.6 3.9 - 5.0 g/dL   Globulin, Total 2.2 1.5 - 4.5 g/dL   Albumin/Globulin Ratio 2.1 1.2 - 2.2   Bilirubin Total 0.4 0.0 - 1.2 mg/dL   Alkaline Phosphatase 46 39 - 117 IU/L   AST 20 0 - 40 IU/L   ALT 22 0 - 32 IU/L  POC Influenza  A&B(BINAX/QUICKVUE)  Result Value Ref Range   Influenza A, POC Negative Negative   Influenza B, POC Negative Negative  POCT CBC  Result Value Ref Range   WBC 9.3 4.6 - 10.2 K/uL   Lymph, poc 3.0 0.6 - 3.4   POC LYMPH PERCENT 32.3 10 - 50 %L   MID (cbc) 0.6 0 - 0.9   POC MID % 6.4 0 - 12 %M   POC Granulocyte 5.7 2 - 6.9   Granulocyte percent 61.3 37 - 80 %G   RBC 4.24 4.04 - 5.48 M/uL   Hemoglobin 13.8 11 - 14.6 g/dL   HCT, POC 74.8 29 - 41 %   MCV 91.9 76 - 111 fL   MCH, POC 32.5 (A) 27 - 31.2 pg   MCHC 35.4 31.8 - 35.4 g/dL   RDW, POC 27.0 %   Platelet Count, POC 244 142 - 424 K/uL   MPV 8.3 0 - 99.8 fL    Review of Systems As above.     Objective:   Physical Exam Vitals signs reviewed.  Constitutional:      General: She is not in acute distress (appears fatigued, but nontoxic. ).    Appearance: She is well-developed. She is not toxic-appearing.  HENT:     Head: Normocephalic and atraumatic.     Right Ear: Hearing, tympanic membrane, ear canal and external ear normal.     Left Ear: Hearing, tympanic membrane, ear canal and external ear normal.     Nose: Nose normal.     Mouth/Throat:     Pharynx: No oropharyngeal exudate.  Eyes:     Conjunctiva/sclera: Conjunctivae normal.     Pupils: Pupils are equal, round, and reactive to light.  Cardiovascular:     Rate and Rhythm: Normal rate and regular rhythm.     Heart sounds: Normal heart sounds. No murmur.  Pulmonary:     Effort: Pulmonary effort is normal. No respiratory distress.     Breath sounds: Normal breath sounds. No wheezing or rhonchi.  Abdominal:     Tenderness: There is abdominal tenderness (RLQ - chronic per patient. ).  Skin:    General: Skin is warm and dry.     Findings: No rash.  Neurological:     Mental Status: She is alert and oriented to person, place, and time.  Psychiatric:        Behavior: Behavior normal.    Vitals:   02/09/19 1127  BP: 112/73  Pulse: 80  Resp: 14  Temp: 98.5 F (36.9  C)  TempSrc: Oral  SpO2: 99%  Weight: 169 lb 6.4 oz (76.8 kg)  Height: 5\' 9"  (1.753 m)       Assessment &  Plan:  EDRINA ARAYA is a 33 y.o. female Fatigue, unspecified type  Asthma with acute exacerbation, unspecified asthma severity, unspecified whether persistent  Generalized weakness  Diarrhea, unspecified type  Palpitations  Dyspnea, unspecified type  Hypernatremia  RLQ abdominal pain  11 days of symptoms as above.  Waxing waning symptoms during that time, but has had progression of her symptoms including worsening diarrhea, generalized weakness, episodic palpitations (may be from albuterol, prednisone) progressive fatigue.  Additionally has required her albuterol nebulizer 3 times per day with 7 days of prednisone.  Does report some improvement in breathing.  Hypernatremia noted on previous blood work.  Does report she is hydrating at home.  Denies travel or exposure to lab confirmed case of coronavirus.  T-max 99.4.  Overall reassuring exam in office.  -Given progression of symptoms as above, persistent need for albuterol neb, recommended further evaluation through emergency room to decide on further testing.  I did not check CBC/EKG or chest x-ray here as that can be performed IN ER if indicated at that time.  She can also discuss respiratory virus testing or other specific testing with emergency room provider.  Plan out of work through Monday, but that can be extended if needed depending on symptoms.  She does have paperwork for her employer for relief and I am happy to complete that as well.  No orders of the defined types were placed in this encounter.  Patient Instructions   Lungs were clear on exam today, vital signs are reassuring, but given your current symptoms, and some worsening symptoms during this illness, I do recommend further evaluation through the emergency room to determine next step in testing.   Please proceed there after leaving our office.  I would  recommend continuing to wear facemask until evaluated through emergency room.   I will review the paperwork for her employer.  Okay to remain out of work until Monday, but we can extend that if needed.  Please follow-up after emergency room visit if needed for further testing/follow-up.   If you have lab work done today you will be contacted with your lab results within the next 2 weeks.  If you have not heard from [place] then please contact us. The fastest way to get your results is to register for My Chart.   IF you received an x-ray today, you will receive an invoice from [facility]. Please contact [facility] at [phone] with questions or concerns regarding your invoice.   IF you received labwork today, you will receive an invoice from [facility]. Please contact [facility] at [phone] with questions or concerns regarding your invoice.   Our billing staff will not be able to assist you with questions regarding bills from these companies.  You will be contacted with the lab results as soon as they are available. The fastest way to get your results is to activate your My Chart account. Instructions are located on the last page of this paperwork. If you have not heard from [place] regarding the results in 2 weeks, please contact this office.       Signed,   Meredith Staggers, MD [facility] at [facility].  02/09/19 12:35 PM

## 2019-02-09 NOTE — ED Notes (Signed)
Pt has list of symptoms she has been tracking since 01/29/2019 with her. Stated it started with her throat then went to her chest then stomach

## 2019-02-10 ENCOUNTER — Encounter (HOSPITAL_BASED_OUTPATIENT_CLINIC_OR_DEPARTMENT_OTHER): Payer: Self-pay | Admitting: Emergency Medicine

## 2019-02-10 ENCOUNTER — Emergency Department (HOSPITAL_COMMUNITY)
Admission: EM | Admit: 2019-02-10 | Discharge: 2019-02-10 | Disposition: A | Discharge status: Home / Self Care | Payer: BC Managed Care – PPO | Attending: Emergency Medicine | Admitting: Emergency Medicine

## 2019-02-10 ENCOUNTER — Emergency Department (HOSPITAL_BASED_OUTPATIENT_CLINIC_OR_DEPARTMENT_OTHER)
Admission: EM | Admit: 2019-02-10 | Discharge: 2019-02-10 | Disposition: A | Discharge status: Home / Self Care | Payer: BC Managed Care – PPO | Source: Home / Self Care | Attending: Emergency Medicine | Admitting: Emergency Medicine

## 2019-02-10 ENCOUNTER — Emergency Department (HOSPITAL_BASED_OUTPATIENT_CLINIC_OR_DEPARTMENT_OTHER): Payer: BC Managed Care – PPO

## 2019-02-10 ENCOUNTER — Encounter: Payer: Self-pay | Admitting: Family Medicine

## 2019-02-10 ENCOUNTER — Other Ambulatory Visit: Payer: Self-pay

## 2019-02-10 ENCOUNTER — Encounter: Payer: Self-pay | Admitting: *Deleted

## 2019-02-10 DIAGNOSIS — B349 Viral infection, unspecified: Secondary | ICD-10-CM

## 2019-02-10 DIAGNOSIS — R1084 Generalized abdominal pain: Secondary | ICD-10-CM

## 2019-02-10 DIAGNOSIS — R05 Cough: Secondary | ICD-10-CM

## 2019-02-10 DIAGNOSIS — R52 Pain, unspecified: Secondary | ICD-10-CM

## 2019-02-10 DIAGNOSIS — R059 Cough, unspecified: Secondary | ICD-10-CM

## 2019-02-10 DIAGNOSIS — R109 Unspecified abdominal pain: Secondary | ICD-10-CM

## 2019-02-10 LAB — COMPREHENSIVE METABOLIC PANEL
ALBUMIN: 4.9 g/dL (ref 3.5–5.0)
ALK PHOS: 50 U/L (ref 38–126)
ALT: 32 U/L (ref 0–44)
AST: 21 U/L (ref 15–41)
Anion gap: 7 (ref 5–15)
BILIRUBIN TOTAL: 0.9 mg/dL (ref 0.3–1.2)
BUN: 12 mg/dL (ref 6–20)
CALCIUM: 9.3 mg/dL (ref 8.9–10.3)
CO2: 26 mmol/L (ref 22–32)
Chloride: 103 mmol/L (ref 98–111)
Creatinine, Ser: 0.55 mg/dL (ref 0.44–1.00)
GFR calc Af Amer: >60 mL/min (ref >60)
GFR calc non Af Amer: >60 mL/min (ref >60)
GLUCOSE: 127 mg/dL — AB (ref 70–99)
Potassium: 3.7 mmol/L (ref 3.5–5.1)
Sodium: 136 mmol/L (ref 135–145)
TOTAL PROTEIN: 7.7 g/dL (ref 6.5–8.1)

## 2019-02-10 LAB — URINALYSIS, ROUTINE W REFLEX MICROSCOPIC
BILIRUBIN URINE: NEGATIVE
Glucose, UA: NEGATIVE mg/dL
Ketones, ur: NEGATIVE mg/dL
Leukocytes,Ua: NEGATIVE
Nitrite: NEGATIVE
PROTEIN: NEGATIVE mg/dL
SPECIFIC GRAVITY, URINE: 1.01 (ref 1.005–1.030)
pH: 8 (ref 5.0–8.0)

## 2019-02-10 LAB — MONONUCLEOSIS SCREEN: MONO SCREEN: NEGATIVE

## 2019-02-10 LAB — CBC
HCT: 48 % — ABNORMAL HIGH (ref 36.0–46.0)
Hemoglobin: 14.9 g/dL (ref 12.0–15.0)
MCH: 30.6 pg (ref 26.0–34.0)
MCHC: 31 g/dL (ref 30.0–36.0)
MCV: 98.6 fL (ref 80.0–100.0)
PLATELETS: 386 10*3/uL (ref 150–400)
RBC: 4.87 MIL/uL (ref 3.87–5.11)
RDW: 12.8 % (ref 11.5–15.5)
WBC: 12.6 10*3/uL — ABNORMAL HIGH (ref 4.0–10.5)
nRBC: 0 % (ref 0.0–0.2)

## 2019-02-10 LAB — URINALYSIS, MICROSCOPIC (REFLEX): Squamous Epithelial / HPF: NONE SEEN (ref 0–5)

## 2019-02-10 LAB — GROUP A STREP BY PCR: Group A Strep by PCR: NOT DETECTED

## 2019-02-10 LAB — PREGNANCY, URINE: PREG TEST UR: NEGATIVE

## 2019-02-10 LAB — LIPASE, BLOOD: Lipase: 21 U/L (ref 11–51)

## 2019-02-10 MED ORDER — METHOCARBAMOL 500 MG PO TABS
1000.0000 mg | ORAL_TABLET | Freq: Once | ORAL | Status: AC
  Administered 2019-02-10: 1000 mg via ORAL
  Filled 2019-02-10: qty 2

## 2019-02-10 MED ORDER — NAPROXEN 500 MG PO TABS: 500.0000 mg | ORAL_TABLET | Freq: Two times a day (BID) | ORAL | 0 refills | Status: DC

## 2019-02-10 MED ORDER — SODIUM CHLORIDE 0.9 % IV BOLUS
1000.0000 mL | Freq: Once | INTRAVENOUS | Status: AC
  Administered 2019-02-10: 1000 mL via INTRAVENOUS

## 2019-02-10 MED ORDER — KETOROLAC TROMETHAMINE 30 MG/ML IJ SOLN
30.0000 mg | Freq: Once | INTRAMUSCULAR | Status: AC
  Administered 2019-02-10: 30 mg via INTRAVENOUS
  Filled 2019-02-10: qty 1

## 2019-02-10 MED ORDER — METHOCARBAMOL 500 MG PO TABS: 750.0000 mg | ORAL_TABLET | Freq: Three times a day (TID) | ORAL | 0 refills | Status: DC

## 2019-02-10 NOTE — Discharge Instructions (Signed)
At this time, I suspect your pain is due to persisting viral symptoms and muscular ache from coughing. Take naproxen twice daily, take Robaxin 3 times a day as needed for muscle relaxer, stay hydrated and rest. Follow-up with your doctor in 3 to 4 days for recheck. Return to the emergency department if you are developing fevers or new or worsening symptoms.

## 2019-02-10 NOTE — ED Provider Notes (Signed)
[facility] EMERGENCY DEPARTMENT Provider Note   CSN: [id number] Arrival date & time: 02/10/19  1722    History   Chief Complaint Chief Complaint  Patient presents with  . Abdominal Pain    HPI Victoria Larsen is a 33 y.o. female.     HPI Patient has had almost 2 weeks of variable symptoms.  There has been some waxing and waning quality.  Patient reports that she was having quite a bit of coughing and felt short of breath.  Cough has actually diminished somewhat but she continues to have some dry cough.  Patient reports that she was also having diarrhea.  Diarrhea as of today seems to have started to abate some.  Patient reports she has aching in both of her flank areas and slightly into the anterior abdomen.  She denies pain or burning with urination.  Patient has been seen by her but symptoms continue to evolve and migrate.  She reports she is very concerned because what started as more upper respiratory symptoms now involves her flanks and abdomen.  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  She reports a number of them travel and she has some concern for coronavirus.  However there are no confirmed cases, reports some have recently returned from a cruise. Past Medical History:  Diagnosis Date  . Allergy   . Anemia   . Anxiety   . Asthma   . Chronic neck pain   . Fibromyalgia   . GERD (gastroesophageal reflux disease)   . Insomnia     Patient Active Problem List   Diagnosis Date Noted  . Breast mass in female 11/09/2018  . Migraine 10/08/2018  . History of asthma 12/22/2017  . Influenza with respiratory manifestation 12/22/2017  . Flu-like symptoms 12/22/2017    Past Surgical History:  Procedure Laterality Date  . colonoscopy    . LAPAROSCOPY Right 09/08/2018   Procedure: LAPAROSCOPY DIAGNOSTIC/Possible Right Ovarian Cystectomy/Possible Ablation of Endometriosis;  Surgeon: Shea Evans, MD;  Location: [facility];  Service:  Gynecology;  Laterality: Right;  . OVARIAN CYST SURGERY    . TONSILLECTOMY    . UPPER GI ENDOSCOPY    . wisdom teeth ext       OB History   No obstetric history on file.      Home Medications    Prior to Admission medications   Medication Sig Start Date End Date Taking? Authorizing Provider  albuterol (PROVENTIL HFA;VENTOLIN HFA) 108 (90 Base) MCG/ACT inhaler Inhale 1-2 puffs into the lungs every 6 (six) hours as needed for shortness of breath. 02/02/19   Shade Flood, MD  albuterol (PROVENTIL) (2.5 MG/3ML) 0.083% nebulizer solution Take 3 mLs (2.5 mg total) by nebulization every 6 (six) hours as needed for shortness of breath. 02/02/19   Shade Flood, MD  ASHWAGANDHA PO Take 1 capsule by mouth daily.    [provider]  ibuprofen (ADVIL) 200 MG tablet Take 3 tablets (600 mg total) by mouth every 6 (six) hours as needed. 09/08/18   Shea Evans, MD  ipratropium-albuterol (DUONEB) 0.5-2.5 (3) MG/3ML SOLN Take 3 mLs by nebulization every 6 (six) hours as needed. 06/13/12   Collene Gobble, MD  methocarbamol (ROBAXIN) 500 MG tablet Take 1.5 tablets (750 mg total) by mouth 3 (three) times daily. 02/10/19   Arby Barrette, MD  naproxen (NAPROSYN) 500 MG tablet Take 1 tablet (500 mg total) by mouth 2 (two) times daily. 02/10/19  Arby Barrette, MD  omeprazole (PRILOSEC) 40 MG capsule Take 40 mg by mouth daily.     [provider]  OVER THE COUNTER MEDICATION Take 1 capsule by mouth daily as needed (FDGARD).    [provider]  OVER THE COUNTER MEDICATION Take 1 capsule by mouth daily. [facility]    [provider]  OVER THE COUNTER MEDICATION Take 1 capsule by mouth daily. 5-Hydroxytryptophan (5-HTP)    [provider]  Peppermint Oil (IBGARD) 90 MG CPCR Take 1 capsule by mouth daily as needed (IBS).    [provider]  predniSONE (DELTASONE) 20 MG tablet Take 2 tablets (40 mg total) by mouth daily with breakfast for 5 days. 02/05/19  02/10/19  Georgina Quint, MD    Family History Family History  Problem Relation Age of Onset  . Heart disease Maternal Grandmother   . Stroke Paternal Grandfather   . Breast cancer Maternal Aunt     Social History Social History   Tobacco Use  . Smoking status: Former Smoker    Packs/day: 0.50    Years: 3.00    Pack years: 1.50    Types: Cigarettes    Last attempt to quit: 05/31/2009    Years since quitting: 9.7  . Smokeless tobacco: Never Used  Substance Use Topics  . Alcohol use: No  . Drug use: No     Allergies   Vicodin [hydrocodone-acetaminophen]; Metronidazole; and Penicillins   Review of Systems Review of Systems 10 Systems reviewed and are negative for acute change except as noted in the HPI.   Physical Exam Updated Vital Signs BP 118/72 (BP Location: Left Arm)   Pulse 77   Temp 97.9 F (36.6 C) (Oral)   Resp 16   LMP 02/03/2019   SpO2 96%   Physical Exam Constitutional:      Comments: Patient is clinically well in appearance.  She is alert and nontoxic.  No respiratory distress.  HENT:     Head: Normocephalic and atraumatic.     Right Ear: Tympanic membrane normal.     Left Ear: Tympanic membrane normal.     Nose: Nose normal.     Mouth/Throat:     Mouth: Mucous membranes are moist.     Pharynx: Oropharynx is clear.  Eyes:     Extraocular Movements: Extraocular movements intact.     Conjunctiva/sclera: Conjunctivae normal.     Pupils: Pupils are equal, round, and reactive to light.  Neck:     Musculoskeletal: Normal range of motion and neck supple.  Cardiovascular:     Rate and Rhythm: Normal rate and regular rhythm.     Pulses: Normal pulses.     Heart sounds: Normal heart sounds.  Pulmonary:     Effort: Pulmonary effort is normal.     Breath sounds: Normal breath sounds.  Abdominal:     Comments: Abdomen soft.  Some moderate lateral abdominal tenderness bilaterally with tenderness to palpation along the flanks.  No guarding.  No  rashes.  No soft tissue abnormalities.  Musculoskeletal: Normal range of motion.        General: No swelling or tenderness.     Right lower leg: No edema.     Left lower leg: No edema.  Skin:    General: Skin is warm and dry.     Findings: No rash.  Neurological:     General: No focal deficit present.     Mental Status: She is oriented to person, place, and  time.     Coordination: Coordination normal.      ED Treatments / Results  Labs (all labs ordered are listed, but only abnormal results are displayed) Labs Reviewed  COMPREHENSIVE METABOLIC PANEL - Abnormal; Notable for the following components:      Result Value   Glucose, Bld 127 (*)    All other components within normal limits  CBC - Abnormal; Notable for the following components:   WBC 12.6 (*)    HCT 48.0 (*)    All other components within normal limits  URINALYSIS, ROUTINE W REFLEX MICROSCOPIC - Abnormal; Notable for the following components:   Hgb urine dipstick TRACE (*)    All other components within normal limits  URINALYSIS, MICROSCOPIC (REFLEX) - Abnormal; Notable for the following components:   Bacteria, UA FEW (*)    All other components within normal limits  GROUP A STREP BY PCR  LIPASE, BLOOD  PREGNANCY, URINE  MONONUCLEOSIS SCREEN    EKG None  Radiology Dg Chest 2 View  Result Date: 02/09/2019 CLINICAL DATA:  Shortness of breath EXAM: CHEST - 2 VIEW COMPARISON:  02/05/2019 FINDINGS: The heart size and mediastinal contours are within normal limits. Both lungs are clear. The visualized skeletal structures are unremarkable. IMPRESSION: No active cardiopulmonary disease. Electronically Signed   By: Deatra Robinson M.D.   On: 02/09/2019 20:05   Ct Renal Stone Study  Result Date: 02/10/2019 CLINICAL DATA:  Flank pain. Stone disease suspected. EXAM: CT ABDOMEN AND PELVIS WITHOUT CONTRAST TECHNIQUE: Multidetector CT imaging of the abdomen and pelvis was performed following the standard protocol without IV  contrast. COMPARISON:  05/05/2018 FINDINGS: Lower chest: No acute findings. Hepatobiliary: No mass visualized on this unenhanced exam. Gallbladder is unremarkable. Pancreas: No mass or inflammatory process visualized on this unenhanced exam. Spleen:  Within normal limits in size. Adrenals/Urinary tract: No evidence of urolithiasis or hydronephrosis. Unremarkable unopacified urinary bladder. Stomach/Bowel: No evidence of obstruction, inflammatory process, or abnormal fluid collections. Normal appendix visualized. Vascular/Lymphatic: No pathologically enlarged lymph nodes identified. No evidence of abdominal aortic aneurysm. Reproductive:  No mass or other significant abnormality. Other:  None. Musculoskeletal:  No suspicious bone lesions identified. IMPRESSION: Negative. No evidence of urolithiasis, hydronephrosis, or other acute findings. Electronically Signed   By: Myles Rosenthal M.D.   On: 02/10/2019 19:10    Procedures Procedures (including critical care time)  Medications Ordered in ED Medications  methocarbamol (ROBAXIN) tablet 1,000 mg (has no administration in time range)  sodium chloride 0.9 % bolus 1,000 mL (1,000 mLs Intravenous New Bag/Given 02/10/19 1914)  ketorolac (TORADOL) 30 MG/ML injection 30 mg (30 mg Intravenous Given 02/10/19 1953)     Initial Impression / Assessment and Plan / ED Course  I have reviewed the triage vital signs and the nursing notes.  Pertinent labs & imaging results that were available during my care of the patient were reviewed by me and considered in my medical decision making (see chart for details).       Patient has had a progression of symptoms that started more suggestive of upper respiratory infection but now involve bilateral flanks and lower abdominal discomfort.  Patient ports she has had a lot of recurrent episodes of abdominal pain with extensive diagnostic work-up.  She reports however this does seem different and she is concerned by the migration  of symptoms.  Clinically, patient is well in appearance.  Physical exam is normal.  CT does not show any inflammatory changes around the kidneys or bases of  the lungs.  She had a normal chest x-ray yesterday.  Patient has mild leukocytosis without any fever or lactic acidosis.  At this time, I have suspicion for resolving viral syndrome.  She previously had a significant bout of coughing and diarrhea but symptoms of which are abating.  She persists in having flank and central abdominal discomfort.  I suspect musculoskeletal pain in conjunction with prior coughing.  At this time, patient is stable for symptomatic treatment and close follow-up with PCP or return to emergency department if worsening or changing symptoms.  Return precautions reviewed.  Final Clinical Impressions(s) / ED Diagnoses   Final diagnoses:  Generalized pain  Generalized abdominal pain  Flank pain  Cough  Viral syndrome    ED Discharge Orders         Ordered    naproxen (NAPROSYN) 500 MG tablet  2 times daily     02/10/19 2038    methocarbamol (ROBAXIN) 500 MG tablet  3 times daily     02/10/19 2038           Arby Barrette, MD 02/10/19 2054

## 2019-02-10 NOTE — ED Triage Notes (Signed)
Reports "sick" since February 29.  Seen at [facility] for same yesterday but states "announcement was made that if I wanted to stay healthy I should leave".  Reports bilateral flank pain and right upper abdominal pain.  Reports N/V/D.

## 2019-02-12 ENCOUNTER — Other Ambulatory Visit: Payer: Self-pay

## 2019-02-12 ENCOUNTER — Encounter: Payer: Self-pay | Admitting: Family Medicine

## 2019-02-12 ENCOUNTER — Other Ambulatory Visit: Payer: Self-pay | Admitting: Family Medicine

## 2019-02-12 ENCOUNTER — Ambulatory Visit (INDEPENDENT_AMBULATORY_CARE_PROVIDER_SITE_OTHER): Payer: BC Managed Care – PPO | Admitting: Family Medicine

## 2019-02-12 VITALS — BP 103/69 | HR 77 | Temp 97.9°F | Ht 69.0 in

## 2019-02-12 DIAGNOSIS — R5381 Other malaise: Secondary | ICD-10-CM

## 2019-02-12 DIAGNOSIS — R05 Cough: Secondary | ICD-10-CM | POA: Diagnosis not present

## 2019-02-12 DIAGNOSIS — R059 Cough, unspecified: Secondary | ICD-10-CM

## 2019-02-12 DIAGNOSIS — J4520 Mild intermittent asthma, uncomplicated: Secondary | ICD-10-CM | POA: Diagnosis not present

## 2019-02-12 MED ORDER — FLUTICASONE PROPIONATE 50 MCG/ACT NA SUSP: 1.0000 | Freq: Two times a day (BID) | NASAL | 6 refills | Status: DC

## 2019-02-12 MED ORDER — MONTELUKAST SODIUM 10 MG PO TABS: 10.0000 mg | ORAL_TABLET | Freq: Every day | ORAL | 3 refills | Status: DC

## 2019-02-12 MED ORDER — ALBUTEROL SULFATE (2.5 MG/3ML) 0.083% IN NEBU: 2.5000 mg | INHALATION_SOLUTION | Freq: Four times a day (QID) | RESPIRATORY_TRACT | 0 refills | Status: DC | PRN

## 2019-02-12 NOTE — Progress Notes (Signed)
3/14/20209:07 AM  Victoria Larsen [birth year], 33 y.o. female [id number]  Chief Complaint  Patient presents with  . Asthma    has sore throat and fever has been to er 2 times for the issue. Says shes never been sick for this long    HPI:   Patient is a 33 y.o. 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 Completed course yesterday Has not taken any antipyretics since yesterday She is worried about coronavirus and would like to be tested She has been staying at home on self-quarantine since her first sx, has not been to work   Fall Risk  02/12/2019 02/09/2019 02/05/2019 02/02/2019 11/09/2018  Falls in the past year? 0 0 0 0 0  Number falls in past yr: 0 0 - 0 -  Injury with Fall? 0 0 - 0 -  Follow up - Falls evaluation completed Falls evaluation completed Falls evaluation completed -     Depression screen [facility] 2/9 02/09/2019 02/05/2019 02/02/2019  Decreased Interest 0 0 0  Down, Depressed, Hopeless 0 0 0  PHQ - 2 Score 0 0 0    Allergies  Allergen Reactions  . Vicodin  [Hydrocodone-Acetaminophen] Nausea And Vomiting  . Metronidazole   . Penicillins Rash    Infant Has patient had a PCN reaction causing immediate rash, facial/tongue/throat swelling, SOB or lightheadedness with hypotension: yes Has patient had a PCN reaction causing severe rash involving mucus membranes or skin necrosis: no Has patient had a PCN reaction that required hospitalization: no Has patient had a PCN reaction occurring within the last 10 years: no If all of the above answers are "NO", then may proceed with Cephalosporin use.     Prior to Admission medications   Medication Sig Start Date End Date Taking? Authorizing Provider  albuterol (PROVENTIL HFA;VENTOLIN HFA) 108 (90 Base) MCG/ACT inhaler Inhale 1-2 puffs into the lungs every 6 (six) hours as needed for shortness of breath. 02/02/19   Shade Flood, MD  albuterol (PROVENTIL) (2.5 MG/3ML) 0.083% nebulizer solution Take 3 mLs (2.5 mg total) by nebulization every 6 (six) hours as needed for shortness of breath. 02/02/19   Shade Flood, MD  ASHWAGANDHA PO Take 1 capsule by mouth daily.    [provider]  ibuprofen (ADVIL) 200 MG tablet Take 3 tablets (600 mg total) by mouth every 6 (six) hours as needed. 09/08/18   Shea Evans, MD  ipratropium-albuterol (DUONEB) 0.5-2.5 (3) MG/3ML SOLN Take 3 mLs by nebulization every 6 (six) hours as needed. 06/13/12   Collene Gobble, MD  methocarbamol (ROBAXIN) 500 MG tablet Take 1.5 tablets (750 mg total) by mouth 3 (three) times daily. 02/10/19   Arby Barrette, MD  naproxen (NAPROSYN) 500 MG tablet Take 1 tablet (500 mg total) by mouth 2 (two) times daily. 02/10/19   Arby Barrette, MD  omeprazole (PRILOSEC) 40 MG capsule Take 40 mg by mouth daily.     [provider]  OVER THE COUNTER MEDICATION Take 1 capsule by mouth daily as needed (FDGARD).    [provider]  OVER THE COUNTER MEDICATION Take 1 capsule by mouth daily. [facility]    [provider]   OVER THE COUNTER MEDICATION Take 1 capsule by mouth daily. 5-Hydroxytryptophan (5-HTP)    [provider]  Peppermint Oil (IBGARD) 90 MG CPCR Take 1 capsule by mouth daily as needed (IBS).    [provider]    Past Medical History:  Diagnosis Date  . Allergy   . Anemia   . Anxiety   . Asthma   . Chronic neck pain   . Fibromyalgia   . GERD (gastroesophageal reflux disease)   . Insomnia     Past Surgical History:  Procedure Laterality Date  . colonoscopy    . LAPAROSCOPY Right 09/08/2018   Procedure: LAPAROSCOPY DIAGNOSTIC/Possible Right Ovarian Cystectomy/Possible Ablation of Endometriosis;  Surgeon: Shea Evans, MD;  Location: [facility];  Service: Gynecology;  Laterality: Right;  . OVARIAN CYST SURGERY    . TONSILLECTOMY    . UPPER GI ENDOSCOPY    . wisdom teeth ext      Social History   Tobacco Use  . Smoking status: Former Smoker    Packs/day: 0.50    Years: 3.00    Pack years: 1.50    Types: Cigarettes    Last attempt to quit: 05/31/2009    Years since quitting: 9.7  . Smokeless tobacco: Never Used  Substance Use Topics  . Alcohol use: No    Family History  Problem Relation Age of Onset  . Heart disease Maternal Grandmother   . Stroke Paternal Grandfather   . Breast cancer Maternal Aunt     ROS Per hpi  OBJECTIVE:  Blood pressure 103/69, pulse 77, temperature 97.9 F (36.6 C), temperature source Oral, height 5\' 9"  (1.753 m), last menstrual period 02/03/2019, SpO2 99 %, peak flow 400 L/min. Body mass index is 24.74 kg/m.   Physical Exam Vitals signs and nursing note reviewed.  Constitutional:      General: She is not in acute distress.    Appearance: She is well-developed. She is not ill-appearing or diaphoretic.  HENT:     Head: Normocephalic and atraumatic.     Right Ear: Hearing, tympanic membrane, ear canal and external ear normal.     Left Ear: Hearing, tympanic membrane, ear canal and external ear normal.     Nose:      Right Turbinates: Swollen.     Left Turbinates: Swollen.     Mouth/Throat:     Mouth: Mucous membranes are moist.     Pharynx: Posterior oropharyngeal erythema present. No pharyngeal swelling or oropharyngeal exudate.     Tonsils: No tonsillar exudate.  Eyes:     Conjunctiva/sclera: Conjunctivae normal.     Pupils: Pupils are equal, round, and reactive to light.  Neck:     Musculoskeletal: Neck supple.  Cardiovascular:     Rate and Rhythm: Normal rate and regular rhythm.     Heart sounds: Normal heart sounds. No murmur. No friction rub. No  gallop.   Pulmonary:     Effort: Pulmonary effort is normal.     Breath sounds: Normal breath sounds. No wheezing, rhonchi or rales.  Lymphadenopathy:     Cervical: No cervical adenopathy.  Skin:    General: Skin is warm and dry.  Neurological:     General: No focal deficit present.     Mental Status: She is alert and oriented to person, place, and time.     Gait: Gait normal.  Psychiatric:        Mood and Affect: Mood normal.        Behavior: Behavior normal.     ASSESSMENT and PLAN  1. Mild intermittent asthma, unspecified whether complicated Patient has completed pred course. No wheezing on exam today. Cont with albuterol prn. Adding singulair and flonase given many environmental exposures. Discussed risk of mice exposures. RTC precautions given.  - albuterol (PROVENTIL) (2.5 MG/3ML) 0.083% nebulizer solution; Take 3 mLs (2.5 mg total) by nebulization every 6 (six) hours as needed for shortness of breath.  2. Cough Discussed very low suspicion for coronavirus. All appropriate droplet and contact precautions were taken. Further actions pending results.  - Respiratory virus panel - Novel Coronavirus, NAA ([facility])  3. Malaise - Respiratory virus panel  Other orders - montelukast (SINGULAIR) 10 MG tablet; Take 1 tablet (10 mg total) by mouth at bedtime. - fluticasone (FLONASE) 50 MCG/ACT nasal spray; Place 1 spray into both nostrils 2  (two) times daily.  No follow-ups on file.    Myles Lipps, MD [facility] at [facility] [address] [place], [place] [address] Ph.  [phone] Fax [phone]

## 2019-02-12 NOTE — Patient Instructions (Signed)
° ° ° °  If you have lab work done today you will be contacted with your lab results within the next 2 weeks.  If you have not heard from us then please contact us. The fastest way to get your results is to register for My Chart. ° ° °IF you received an x-ray today, you will receive an invoice from [facility]. Please contact [facility] at [phone] with questions or concerns regarding your invoice.  ° °IF you received labwork today, you will receive an invoice from [facility]. Please contact [facility] at [phone] with questions or concerns regarding your invoice.  ° °Our billing staff will not be able to assist you with questions regarding bills from these companies. ° °You will be contacted with the lab results as soon as they are available. The fastest way to get your results is to activate your My Chart account. Instructions are located on the last page of this paperwork. If you have not heard from us regarding the results in 2 weeks, please contact this office. °  ° ° ° °

## 2019-02-15 ENCOUNTER — Encounter: Payer: Self-pay | Admitting: Family Medicine

## 2019-02-16 LAB — NOVEL CORONAVIRUS, NAA: SARS-CoV-2, NAA: NOT DETECTED

## 2019-02-17 ENCOUNTER — Telehealth: Payer: Self-pay | Admitting: *Deleted

## 2019-02-17 ENCOUNTER — Encounter: Payer: Self-pay | Admitting: *Deleted

## 2019-02-17 LAB — RESPIRATORY VIRUS PANEL
Adenovirus: NEGATIVE
Influenza A: NEGATIVE
Influenza B: NEGATIVE
Metapneumovirus: NEGATIVE
Parainfluenza 1: NEGATIVE
Parainfluenza 2: NEGATIVE
Parainfluenza 3: NEGATIVE
Respiratory Syncytial Virus A: NEGATIVE
Respiratory Syncytial Virus B: NEGATIVE
Rhinovirus: POSITIVE — AB

## 2019-02-17 NOTE — Telephone Encounter (Signed)
Please inform patient to check her my-chart   COVID is negative, can come out of quarantine.  Unable to leave message on voicemail .Victoria Larsen

## 2019-02-19 ENCOUNTER — Encounter: Payer: Self-pay | Admitting: Family Medicine

## 2019-04-01 ENCOUNTER — Ambulatory Visit: Payer: Self-pay | Admitting: *Deleted

## 2019-04-01 NOTE — Telephone Encounter (Signed)
Patient states she has had chest pain for 1 month. Patient states she has had flutters with the chest pain. Patient states she has different feelings- feels her heart is working harder then it should and she called today.  Patient does have acid reflux- but this is different. Call drop during disposition- advising patient Ed for chest pain work up- patient wants to know if she can go to UC. Call patient back- advised either one- she will be directed ED if UC feels she needs to go there.  Reason for Disposition . SEVERE chest pain  Answer Assessment - Initial Assessment Questions 1. LOCATION: "Where does it hurt?"       Center of chest- sternum 2. RADIATION: "Does the pain go anywhere else?" (e.g., into neck, jaw, arms, back)     Patient can feel discomfort is left chest 3. ONSET: "When did the chest pain begin?" (Minutes, hours or days)      1 month ago 4. PATTERN "Does the pain come and go, or has it been constant since it started?"  "Does it get worse with exertion?"      Comes and goes, yes 5. DURATION: "How long does it last" (e.g., seconds, minutes, hours)     Couple hours- but what started last night has lasted- burning has stayed consistent  6. SEVERITY: "How bad is the pain?"  (e.g., Scale 1-10; mild, moderate, or severe)    - MILD (1-3): doesn't interfere with normal activities     - MODERATE (4-7): interferes with normal activities or awakens from sleep    - SEVERE (8-10): excruciating pain, unable to do any normal activities       2 7. CARDIAC RISK FACTORS: "Do you have any history of heart problems or risk factors for heart disease?" (e.g., prior heart attack, angina; high blood pressure, diabetes, being overweight, high cholesterol, smoking, or strong family history of heart disease)     Family history, asthma 8. PULMONARY RISK FACTORS: "Do you have any history of lung disease?"  (e.g., blood clots in lung, asthma, emphysema, birth control pills)     asthma 9. CAUSE: "What do  you think is causing the chest pain?"     History of acid reflux- irregular heart rate 10. OTHER SYMPTOMS: "Do you have any other symptoms?" (e.g., dizziness, nausea, vomiting, sweating, fever, difficulty breathing, cough)       Diarrhea- IBS, some SOB episodes, nausea- IBS, reflux 11. PREGNANCY: "Is there any chance you are pregnant?" "When was your last menstrual period?"       No- LMP- 03/27/19  Protocols used: CHEST PAIN-A-AH

## 2019-04-13 ENCOUNTER — Encounter: Payer: Self-pay | Admitting: Family Medicine

## 2019-04-14 ENCOUNTER — Telehealth: Payer: Self-pay | Admitting: Family Medicine

## 2019-04-14 NOTE — Telephone Encounter (Signed)
I do remember completing her paperwork, and likely was placed in the FMLA box but I do not see where that has been scanned.  I am not sure if that was a time when Victoria Larsen was still doing FMLA or if Victoria Larsen may have a copy of paperwork.  If we are unable to find it, then may need to request again from her employer and complete again unfortunately.

## 2019-04-14 NOTE — Telephone Encounter (Signed)
Copied from CRM [id number]. Topic: General - Other >> Apr 14, 2019 10:27 AM Leafy Ro wrote: Reason for CRM: pt saw dr Genevie Ann on 02-09-2019 and per patient she paid 30 dollars to have FMLA paperwork completed. Pt employer never received the fmla. Pt would like a copy mail to home address or put in her mychart. Pt also sent mychart message

## 2019-04-19 ENCOUNTER — Encounter: Payer: Self-pay | Admitting: Family Medicine

## 2019-04-19 NOTE — Telephone Encounter (Signed)
Pt called back to check status on request. Please advise.  Best Contact: [phone]

## 2019-12-02 DIAGNOSIS — J342 Deviated nasal septum: Secondary | ICD-10-CM | POA: Insufficient documentation

## 2019-12-23 ENCOUNTER — Other Ambulatory Visit: Payer: Self-pay | Admitting: Internal Medicine

## 2019-12-23 DIAGNOSIS — R1310 Dysphagia, unspecified: Secondary | ICD-10-CM

## 2019-12-23 DIAGNOSIS — K219 Gastro-esophageal reflux disease without esophagitis: Secondary | ICD-10-CM

## 2019-12-30 ENCOUNTER — Ambulatory Visit
Admission: RE | Admit: 2019-12-30 | Discharge: 2019-12-30 | Disposition: A | Discharge status: Home / Self Care | Payer: BC Managed Care – PPO | Source: Ambulatory Visit | Attending: Internal Medicine | Admitting: Internal Medicine

## 2019-12-30 DIAGNOSIS — K219 Gastro-esophageal reflux disease without esophagitis: Secondary | ICD-10-CM

## 2019-12-30 DIAGNOSIS — R1310 Dysphagia, unspecified: Secondary | ICD-10-CM

## 2022-01-13 ENCOUNTER — Emergency Department (HOSPITAL_BASED_OUTPATIENT_CLINIC_OR_DEPARTMENT_OTHER): Payer: Commercial Managed Care - HMO

## 2022-01-13 ENCOUNTER — Other Ambulatory Visit: Payer: Self-pay

## 2022-01-13 ENCOUNTER — Encounter (HOSPITAL_BASED_OUTPATIENT_CLINIC_OR_DEPARTMENT_OTHER): Payer: Self-pay

## 2022-01-13 ENCOUNTER — Emergency Department (HOSPITAL_BASED_OUTPATIENT_CLINIC_OR_DEPARTMENT_OTHER)
Admission: EM | Admit: 2022-01-13 | Discharge: 2022-01-13 | Disposition: A | Discharge status: Home / Self Care | Payer: Commercial Managed Care - HMO | Attending: Emergency Medicine | Admitting: Emergency Medicine

## 2022-01-13 ENCOUNTER — Emergency Department (HOSPITAL_BASED_OUTPATIENT_CLINIC_OR_DEPARTMENT_OTHER): Payer: Commercial Managed Care - HMO | Admitting: Radiology

## 2022-01-13 DIAGNOSIS — M62838 Other muscle spasm: Secondary | ICD-10-CM | POA: Diagnosis not present

## 2022-01-13 DIAGNOSIS — M542 Cervicalgia: Secondary | ICD-10-CM | POA: Diagnosis present

## 2022-01-13 DIAGNOSIS — M62830 Muscle spasm of back: Secondary | ICD-10-CM | POA: Insufficient documentation

## 2022-01-13 DIAGNOSIS — Y92410 Unspecified street and highway as the place of occurrence of the external cause: Secondary | ICD-10-CM | POA: Insufficient documentation

## 2022-01-13 HISTORY — DX: Other intervertebral disc degeneration, lumbar region without mention of lumbar back pain or lower extremity pain: M51.369

## 2022-01-13 HISTORY — DX: Disorder of thyroid, unspecified: E07.9

## 2022-01-13 HISTORY — DX: Other intervertebral disc degeneration, lumbar region: M51.36

## 2022-01-13 MED ORDER — METHYLPREDNISOLONE 4 MG PO TBPK: ORAL_TABLET | ORAL | 0 refills | Status: DC

## 2022-01-13 MED ORDER — KETOROLAC TROMETHAMINE 60 MG/2ML IM SOLN
60.0000 mg | Freq: Once | INTRAMUSCULAR | Status: AC
  Administered 2022-01-13: 60 mg via INTRAMUSCULAR
  Filled 2022-01-13: qty 2
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
## 2022-01-13 NOTE — ED Notes (Signed)
Patient transported to X-ray 

## 2022-01-13 NOTE — ED Notes (Signed)
Report received and care resumed.
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
## 2022-02-13 ENCOUNTER — Encounter: Payer: Self-pay | Admitting: Neurology

## 2022-02-13 ENCOUNTER — Ambulatory Visit: Payer: Managed Care, Other (non HMO) | Admitting: Neurology

## 2022-02-13 VITALS — BP 147/77 | HR 73 | Ht 69.0 in | Wt 200.0 lb

## 2022-02-13 DIAGNOSIS — R519 Headache, unspecified: Secondary | ICD-10-CM | POA: Diagnosis not present

## 2022-02-13 DIAGNOSIS — F0781 Postconcussional syndrome: Secondary | ICD-10-CM | POA: Diagnosis not present

## 2022-02-13 MED ORDER — AMITRIPTYLINE HCL 25 MG PO TABS: 25.0000 mg | ORAL_TABLET | Freq: Every day | ORAL | 0 refills | Status: DC
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
## 2022-02-13 NOTE — Patient Instructions (Signed)
Start with Amitriptyline 25 mg 1/2 tablet nightly for one week then increase to full tablet  ?Contact me in one month via mychart for refill  ?Start with physical activity at home, as little as walking 20 min a day, 5 days a week  ?Return in 6 months  ?

## 2022-02-23 ENCOUNTER — Encounter: Payer: Self-pay | Admitting: Neurology

## 2022-02-28 ENCOUNTER — Encounter: Payer: Self-pay | Admitting: Neurology

## 2022-03-03 ENCOUNTER — Encounter: Payer: Self-pay | Admitting: Neurology

## 2022-03-03 ENCOUNTER — Ambulatory Visit: Payer: Managed Care, Other (non HMO) | Admitting: Psychiatry

## 2022-03-07 ENCOUNTER — Other Ambulatory Visit: Payer: Self-pay | Admitting: Neurology

## 2022-03-10 NOTE — Telephone Encounter (Signed)
Rx refilled for 90 day supply. 

## 2022-03-12 ENCOUNTER — Encounter: Payer: Self-pay | Admitting: Neurology

## 2022-03-17 ENCOUNTER — Encounter: Payer: Self-pay | Admitting: Neurology

## 2022-03-21 ENCOUNTER — Other Ambulatory Visit: Payer: Self-pay | Admitting: Internal Medicine

## 2022-03-21 DIAGNOSIS — G4452 New daily persistent headache (NDPH): Secondary | ICD-10-CM

## 2022-03-24 ENCOUNTER — Encounter: Payer: Self-pay | Admitting: Neurology

## 2022-03-24 ENCOUNTER — Encounter: Payer: Self-pay | Admitting: *Deleted

## 2022-03-24 NOTE — Telephone Encounter (Signed)
Not sure if a beta blocker is a good choice for her as it can exacerbate asthma. What dose of amitriptyline is she on currently?  ?We may be able to start propranolol low dose 20 mg once daily if she has only very mild asthma. Please discuss with patient first.  ?

## 2022-03-24 NOTE — Telephone Encounter (Signed)
Left message for the patient to return my call.

## 2022-03-25 ENCOUNTER — Other Ambulatory Visit: Payer: Self-pay | Admitting: *Deleted

## 2022-03-25 DIAGNOSIS — J4520 Mild intermittent asthma, uncomplicated: Secondary | ICD-10-CM

## 2022-03-25 MED ORDER — PROPRANOLOL HCL 20 MG PO TABS: 20.0000 mg | ORAL_TABLET | Freq: Every day | ORAL | 0 refills | Status: DC

## 2022-03-25 NOTE — Telephone Encounter (Signed)
I spoke to the patient. She has now tapered off amitriptyline 25mg  (reducing slowly). Over the last week, she has taken 0.25 tabs QHS. Last dose 03/24/22.  ? ?Reports her asthma is extremely mild. Estimates using her inhaler about once each year. She is agreeable to try the low dose propranolol 20mg , one tab QD. ?

## 2022-03-25 NOTE — Addendum Note (Signed)
Addended by: Desmond Lope on: 03/25/2022 09:18 AM ? ? Modules accepted: Orders ? ?

## 2022-04-02 ENCOUNTER — Telehealth: Payer: Self-pay | Admitting: Rehabilitation

## 2022-04-02 ENCOUNTER — Ambulatory Visit
Admission: RE | Admit: 2022-04-02 | Discharge: 2022-04-02 | Disposition: A | Discharge status: Home / Self Care | Payer: Managed Care, Other (non HMO) | Source: Ambulatory Visit | Attending: Internal Medicine | Admitting: Internal Medicine

## 2022-04-02 ENCOUNTER — Ambulatory Visit: Payer: Commercial Managed Care - HMO | Attending: Internal Medicine | Admitting: Rehabilitation

## 2022-04-02 ENCOUNTER — Encounter: Payer: Self-pay | Admitting: Rehabilitation

## 2022-04-02 ENCOUNTER — Other Ambulatory Visit: Payer: Self-pay

## 2022-04-02 DIAGNOSIS — F0781 Postconcussional syndrome: Secondary | ICD-10-CM | POA: Diagnosis not present

## 2022-04-02 DIAGNOSIS — R2681 Unsteadiness on feet: Secondary | ICD-10-CM | POA: Insufficient documentation

## 2022-04-02 DIAGNOSIS — R42 Dizziness and giddiness: Secondary | ICD-10-CM | POA: Insufficient documentation

## 2022-04-02 DIAGNOSIS — R293 Abnormal posture: Secondary | ICD-10-CM | POA: Diagnosis present

## 2022-04-02 DIAGNOSIS — R41841 Cognitive communication deficit: Secondary | ICD-10-CM | POA: Insufficient documentation

## 2022-04-02 DIAGNOSIS — G4452 New daily persistent headache (NDPH): Secondary | ICD-10-CM

## 2022-04-02 DIAGNOSIS — R4789 Other speech disturbances: Secondary | ICD-10-CM | POA: Diagnosis not present
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
## 2022-04-02 NOTE — Telephone Encounter (Signed)
Entered in error

## 2022-04-08 ENCOUNTER — Ambulatory Visit: Payer: Commercial Managed Care - HMO | Admitting: Rehabilitation

## 2022-04-08 ENCOUNTER — Encounter: Payer: Self-pay | Admitting: Rehabilitation

## 2022-04-08 DIAGNOSIS — F0781 Postconcussional syndrome: Secondary | ICD-10-CM | POA: Diagnosis not present

## 2022-04-08 DIAGNOSIS — R2681 Unsteadiness on feet: Secondary | ICD-10-CM

## 2022-04-08 DIAGNOSIS — R42 Dizziness and giddiness: Secondary | ICD-10-CM

## 2022-04-08 DIAGNOSIS — R293 Abnormal posture: Secondary | ICD-10-CM
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
## 2022-04-10 ENCOUNTER — Ambulatory Visit: Payer: Commercial Managed Care - HMO | Admitting: Speech Pathology

## 2022-04-10 DIAGNOSIS — R2681 Unsteadiness on feet: Secondary | ICD-10-CM | POA: Diagnosis not present

## 2022-04-10 DIAGNOSIS — R41841 Cognitive communication deficit: Secondary | ICD-10-CM

## 2022-04-10 DIAGNOSIS — F0781 Postconcussional syndrome: Secondary | ICD-10-CM | POA: Diagnosis not present

## 2022-04-15 ENCOUNTER — Encounter: Payer: Self-pay | Admitting: Rehabilitation

## 2022-04-15 ENCOUNTER — Ambulatory Visit: Payer: Commercial Managed Care - HMO | Admitting: Speech Pathology

## 2022-04-15 ENCOUNTER — Ambulatory Visit: Payer: Commercial Managed Care - HMO | Admitting: Rehabilitation

## 2022-04-15 DIAGNOSIS — R42 Dizziness and giddiness: Secondary | ICD-10-CM

## 2022-04-15 DIAGNOSIS — R2681 Unsteadiness on feet: Secondary | ICD-10-CM | POA: Diagnosis not present

## 2022-04-15 DIAGNOSIS — R293 Abnormal posture: Secondary | ICD-10-CM

## 2022-04-15 DIAGNOSIS — F0781 Postconcussional syndrome: Secondary | ICD-10-CM | POA: Diagnosis not present

## 2022-04-15 DIAGNOSIS — R41841 Cognitive communication deficit: Secondary | ICD-10-CM
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
## 2022-04-15 NOTE — Patient Instructions (Signed)
Attention: Gateway for understanding, memory, thoughts, responses, etc. Important for everything.  ? ?Internal and External Distractions ? ?Internal: mind wandering ?+stress ?+hunger ?+thinking about to-do list ?+wondering if you completed something you needed to ? ?External: environmental stimuli that distracts you ?+phone ?+tv ?+extraneous conversations around you ?+noisy background ?+people ? ?How to be more present as you get back out there into social interactions:  ?+not over scheduling yourself ?+meeting new people, without pre-conceived notions or feeling judged ?+be upfront with what you're experiencing (self-advocacy) ?+manage your stress - meditation, exercising, visualization (brick wall) ?+consider what you're doing, where you're doing it, when you're doing it ?Ex: restaurant- choose one that isn't crazy busy, with loud music and lots of TVs, go during off time, consider where you're sitting  ?+Spend time with people who don't tax your system  ?+time of day ? ?Goal is to not mitigate all frustration, but not get to level of discouragement.  ? ? ?To-Do Lists: ?Keep running list to keep track of everything you don't want to forget to do ?Make a REALISTIC, non-negotiable, daily to-do list. 3-4 MUST do, 2-3 would like to do  ? ?Tuesday 04/15/2022: ?MUST DO: ? PT/ST ? Call CPAP machine people ? Meal prep breakfast ? Therapy exercises  ? ?WOULD LIKE TO DO: ?Go for a walk ?  ?  ? ?Do this daily. Consider when making your list, what can you handle.  ?

## 2022-04-16 ENCOUNTER — Other Ambulatory Visit: Payer: Self-pay | Admitting: Neurology

## 2022-04-21 ENCOUNTER — Encounter: Payer: Self-pay | Admitting: Rehabilitation

## 2022-04-21 ENCOUNTER — Ambulatory Visit: Payer: Commercial Managed Care - HMO

## 2022-04-21 ENCOUNTER — Ambulatory Visit: Payer: Commercial Managed Care - HMO | Admitting: Rehabilitation

## 2022-04-21 DIAGNOSIS — R42 Dizziness and giddiness: Secondary | ICD-10-CM

## 2022-04-21 DIAGNOSIS — R41841 Cognitive communication deficit: Secondary | ICD-10-CM

## 2022-04-21 DIAGNOSIS — R2681 Unsteadiness on feet: Secondary | ICD-10-CM

## 2022-04-21 DIAGNOSIS — F0781 Postconcussional syndrome: Secondary | ICD-10-CM | POA: Diagnosis not present

## 2022-04-21 DIAGNOSIS — R293 Abnormal posture: Secondary | ICD-10-CM
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
## 2022-04-21 NOTE — Patient Instructions (Addendum)
Daily Journal (daily events) Use on your journals  Reflect back at end of day and write down daily events  Remember, this is supposed to straightforward and simple Keep it concise   Talk to your therapists about any modifications you need to in order to help you feel successful:  Victoria Larsen - exercises and recommendation for vision  Therapist - slight bulging disc C6&7, want to continue PT

## 2022-04-22 ENCOUNTER — Ambulatory Visit: Payer: Managed Care, Other (non HMO) | Admitting: Physical Therapy

## 2022-04-23 ENCOUNTER — Ambulatory Visit: Payer: Commercial Managed Care - HMO | Admitting: Speech Pathology

## 2022-04-23 ENCOUNTER — Ambulatory Visit: Payer: Commercial Managed Care - HMO

## 2022-04-23 DIAGNOSIS — R2681 Unsteadiness on feet: Secondary | ICD-10-CM

## 2022-04-23 DIAGNOSIS — R41841 Cognitive communication deficit: Secondary | ICD-10-CM

## 2022-04-23 DIAGNOSIS — F0781 Postconcussional syndrome: Secondary | ICD-10-CM | POA: Diagnosis not present

## 2022-04-23 DIAGNOSIS — R42 Dizziness and giddiness: Secondary | ICD-10-CM
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
## 2022-04-23 NOTE — Patient Instructions (Signed)
Get a new planner with daily pages Plan your to-do list-- put three maybe four items on the daily to-do list Daily non-negotiable -- things you have to do that day appointments, paying bills by due date, medications, scheduled activities  Daily "want to dos" -- things it would be best to accomplish, if anything these are the things you can move around Schedule out for the week or month based on manageable expectations for what you can accomplish Cross off items you complete Move items you don't complete to a new day when you can manage them
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
## 2022-04-24 ENCOUNTER — Ambulatory Visit: Payer: Managed Care, Other (non HMO) | Admitting: Physical Therapy

## 2022-04-29 ENCOUNTER — Encounter: Payer: Self-pay | Admitting: Physical Therapy

## 2022-04-29 ENCOUNTER — Ambulatory Visit: Payer: Commercial Managed Care - HMO | Admitting: Physical Therapy

## 2022-04-29 ENCOUNTER — Encounter: Payer: Self-pay | Admitting: Psychology

## 2022-04-29 DIAGNOSIS — R2681 Unsteadiness on feet: Secondary | ICD-10-CM

## 2022-04-29 DIAGNOSIS — R42 Dizziness and giddiness: Secondary | ICD-10-CM

## 2022-04-29 DIAGNOSIS — F0781 Postconcussional syndrome: Secondary | ICD-10-CM | POA: Diagnosis not present

## 2022-04-29 NOTE — Therapy (Signed)
OUTPATIENT PHYSICAL THERAPY TREATMENT NOTE   Patient Name: Victoria Larsen MRN: [id number] DOB:[birth year], 36 y.o., adult Today's Date: 04/29/2022  PCP: Andy Gauss, NP REFERRING PROVIDER: Fredda Hammed, PA  END OF SESSION:   PT End of Session - 04/29/22 1208     Visit Number 6    Number of Visits 17    Date for PT Re-Evaluation 06/01/22    Authorization Type [employer]    Authorization - Visit Number 30    PT Start Time 1020    PT Stop Time 1100    PT Time Calculation (min) 40 min    Activity Tolerance Patient tolerated treatment well    Behavior During Therapy WFL for tasks assessed/performed               Past Medical History:  Diagnosis Date   Allergy    Anemia    Anxiety    Asthma    Bulging lumbar disc    Chronic neck pain    Fibromyalgia    GERD (gastroesophageal reflux disease)    Insomnia    Thyroid disease    Past Surgical History:  Procedure Laterality Date   colonoscopy     LAPAROSCOPY Right 09/08/2018   Procedure: LAPAROSCOPY DIAGNOSTIC/Possible Right Ovarian Cystectomy/Possible Ablation of Endometriosis;  Surgeon: Azucena Fallen, MD;  Location: [facility];  Service: Gynecology;  Laterality: Right;   OVARIAN CYST SURGERY     TONSILLECTOMY     UPPER GI ENDOSCOPY     wisdom teeth ext     Patient Active Problem List   Diagnosis Date Noted   Periumbilical pain 36/14/4315   OSA (obstructive sleep apnea) 07/01/2021   Residual hemorrhoidal skin tags 04/05/2021   Chronic idiopathic constipation 02/25/2021   Diverticular disease of colon 02/25/2021   Mild intermittent asthma 02/25/2021   Recurrent hematuria 11/28/2020   Bladder polyps 10/31/2020   Hypertrophy of both inferior nasal turbinates 05/29/2020   Rhinorrhea 05/29/2020   Menstrual bleeding problem 04/23/2020   Right hip pain 04/23/2020   Thyroiditis 04/23/2020   Shortness of breath 12/26/2019   Chronic sinusitis 12/02/2019   Deviated septum 12/02/2019   Hashimoto's thyroiditis  12/02/2019   Iron deficiency 05/07/2019   Mixed hyperlipidemia 05/02/2019   Moderate persistent asthma without complication 40/07/6760   Panic anxiety syndrome 04/06/2019   Breast mass in female 11/09/2018   Migraine 10/08/2018   Chronic neck pain 07/20/2018   Backache 07/12/2018   History of asthma 12/22/2017   Influenza with respiratory manifestation 12/22/2017   Flu-like symptoms 12/22/2017   Bulging lumbar disc 05/03/2017   Sore throat, chronic 10/15/2015   Celiac disease 01/01/2010   Ruptured ovarian cyst 01/01/2010   Abdominal pain, chronic, right lower quadrant 12/01/2009   Allergic rhinitis, unspecified 12/01/1998   Rationale for Evaluation and Treatment Rehabilitation   REFERRING DIAG: 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 Yes: NPRS scale: 5/10 Pain location: lower back, neck and head  Pain description: headache; dull ache in low back and neck soreness Aggravating  factors: lights and sounds  Relieving factors: being in quiet space  Pt is receiving ortho PT for her back and neck at [place]:  NeuroRe-ed:  Gaze Stabilization:  VOR x 1 Horizontal: x 60 seconds Increased blinking noted and eye fatigue with increased time VOR x 1 Vertical: 60 seconds, 1 rep, mild dizziness, also reported increase in HA with completion.  Increased blinking after 45 secs performing exercise with vertical head movement  Pt performed standing on floor - making circles clockwise 5  reps; counterclockwise 5 reps; progressed to standing on pillow - making circles clockwise 5 reps and then counterclockwise 5 reps; marching on floor tossing and catching ball; progressed to marching on pillow tossing and catching ball with SBA  Standing on 2 pillows - trunk rotations - side to side across; diagonal up/down "X" 5 reps each direction with EO & then with EC straight across, then up/down each direction  Marching on incline - focusing on object straight ahead; then marching with eyes closed; marching with EO horizontal head turns 5 reps; vertical head turns 5 reps EO;  Pt amb. Up and down ramp with EC 2 reps with CGA  Seated gentle bouncing on blue physioball - head turns horizontal 5 reps; vertical head turns 5 reps with EO and then performed with EC with gentle bouncing  Amb. Reading cards side to side in various locations 35' x 4 reps  PATIENT EDUCATION: Education details: Continue with HEP  - gaze stabilization in standing Person educated: Patient Education method: Explanation and Verbal cues Education comprehension: verbalized understanding and needs further education   Home Exercise Program:  Access Code: [email or web address] URL: [email or web address] Date: 04/21/2022 Prepared by: Cameron Sprang  Exercises - Romberg Stance on Foam Pad  - 1 x daily - 7 x weekly - 1 sets - 3 reps - 20 secs hold - Romberg Stance on Foam Pad with Head Rotation  - 1 x daily - 7 x weekly - 2 sets - 2 reps - Romberg Stance with Head Nods on Foam Pad  - 1 x daily - 7 x weekly - 2 sets - 5 reps - Romberg Stance Eyes Closed on Foam Pad  - 1 x daily - 7 x weekly - 1 sets - 3 reps - 20 secs hold - Standing Gaze Stabilization with Head Nod  - 1 x daily - 7 x weekly - 1 sets - 2 reps - 30 secs hold - Standing Gaze Stabilization with Head Rotation  - 1 x daily - 7 x weekly - 1 sets - 2 reps - 30 secs hold - Standing on Foam Gaze Stabilization with Two Near Targets and Head Rotation  - 1 x daily - 7  x weekly - 1 sets - 10 reps - Standing on Foam Gaze Stabilization with Two Near Targets and Head Nod  - 1 x daily - 7 x weekly - 1 sets - 10 reps   GOALS: Goals reviewed with patient? Yes   SHORT TERM GOALS: Target date: 04/30/2022   Pt will be IND with independent HEP in order to indicate improved functional mobility and dec fall risk. Baseline: reports independence with current HEP Goal status: MET   2.  Pt will improve gait speed to 3.0 ft/sec in order to indicate dec fall risk and improved efficiency of gait.    Baseline: 2.7 ft/sec; 3.01 ft/sec Goal status: MET   3.  Pt will negotiate up/down 4 steps without rail, up/down ramp and curb  and ambulate x 1000' outdoors over varying surfaces at S level in order to indicate improved community mobility.    Baseline: CGA without use of rail, Mod I with gait, curb and ramp. Goal status: PARTIALLY MET   4.  Pt will complete [facility] post concussive questionnaire and LTG to be added to reflect progress.  Baseline: RPQ-3: 9, RPQ-13:36 Goal status: met    5.  Pt will complete SOT and STG to be updated and LTG to be set to reflect progress.  Baseline: Assessed on 04/15/22 Goal status: MET       LONG TERM GOALS: Target date: 05/28/2022   Pt will be IND with final HEP in order to indicate improved functional mobility and dec fall risk.  Baseline:  Goal status: INITIAL   2.  Pt will improve gait speed to >/=3.6 ft/sec in order to indicate dec fall risk.   Baseline:  Goal status: INITIAL   3.  Pt will negotiate up/down 12 steps without rail, up/down ramp and curb and ambulate x 1000' outdoors over varying surfaces while scanning environment at mod I level in order to indicate improved community mobility.    Baseline:  Goal status: INITIAL   4.  Pt will tolerate going grocery shopping/out to eat (social outings) with no more than 2-3 point increase in symptoms.  Baseline:  Goal status: INITIAL   5.  SOT goal to be set upon  assessment.  Baseline:  Goal status: INITIAL  6. Pt will improve [facility] Post Concussion Symptoms Questionnaire to </=25 in order to demonstrate less debility with everyday tasks.    Baseline: See above  Goal Status: Initial        ASSESSMENT:   CLINICAL IMPRESSION: Pt able to perform x1 viewing in standing for 60 secs for both horizontal & vertical head turns; pt had increased c/o dizziness and increased eye blinking after approx. 40 secs with vertical and after approx. 45 secs with horizontal.  Pt able to perform backwards amb. With 1 occurrence of instability but was able to independently recover balance. Pt reports most symptoms provoked with eye/head movements.  Will continue per POC. Will continue to progress toward all LTGs.      OBJECTIVE IMPAIRMENTS Abnormal gait, decreased activity tolerance, decreased balance, decreased coordination, decreased mobility, dizziness, and impaired perceived functional ability.    ACTIVITY LIMITATIONS cleaning, community activity, driving, occupation, and shopping.    PERSONAL FACTORS Time since onset of injury/illness/exacerbation and 1-2 comorbidities: see above  are also affecting patient's functional outcome.      REHAB POTENTIAL: Excellent   CLINICAL DECISION MAKING: Evolving/moderate complexity   EVALUATION COMPLEXITY: Moderate     PLAN: PT FREQUENCY: 2x/week   PT DURATION: 8 weeks   PLANNED INTERVENTIONS: Therapeutic exercises, Therapeutic activity, Neuromuscular re-education, Balance training, Gait training, Patient/Family education, Vestibular training, Canalith repositioning, and Visual/preceptual remediation/compensation   PLAN FOR NEXT SESSION: Make sure to not do cervical or LBP as they are getting PT at another clinic for this. Continue Gaze stabilization. Saccades. Visual Tracking. Dual Tasking and High Level Balance.      Guido Sander, [place] [address] [place], [place], [address] Phone: [phone]   Fax:  [phone] 04/29/22, 12:32 PM

## 2022-05-01 ENCOUNTER — Ambulatory Visit: Payer: Commercial Managed Care - HMO | Attending: Internal Medicine | Admitting: Physical Therapy

## 2022-05-01 ENCOUNTER — Encounter: Payer: Self-pay | Admitting: Physical Therapy

## 2022-05-01 DIAGNOSIS — R2681 Unsteadiness on feet: Secondary | ICD-10-CM | POA: Diagnosis present

## 2022-05-01 DIAGNOSIS — R41841 Cognitive communication deficit: Secondary | ICD-10-CM | POA: Diagnosis present

## 2022-05-01 DIAGNOSIS — R42 Dizziness and giddiness: Secondary | ICD-10-CM | POA: Diagnosis present

## 2022-05-01 DIAGNOSIS — R293 Abnormal posture: Secondary | ICD-10-CM | POA: Diagnosis present

## 2022-05-01 NOTE — Therapy (Signed)
OUTPATIENT PHYSICAL THERAPY TREATMENT NOTE   Patient Name: Victoria Larsen MRN: [id number] DOB:11/23/1986, 36 y.o., adult Today's Date: 05/01/2022  PCP: Andy Gauss, NP REFERRING PROVIDER: Fredda Hammed, PA  END OF SESSION:   PT End of Session - 05/01/22 1421     Visit Number 7    Number of Visits 17    Date for PT Re-Evaluation 06/01/22    Authorization Type [employer]    Authorization - Visit Number 7    Authorization - Number of Visits 30    PT Start Time 1017    PT Stop Time 1100    PT Time Calculation (min) 43 min    Activity Tolerance Patient tolerated treatment well    Behavior During Therapy WFL for tasks assessed/performed               Past Medical History:  Diagnosis Date   Allergy    Anemia    Anxiety    Asthma    Bulging lumbar disc    Chronic neck pain    Fibromyalgia    GERD (gastroesophageal reflux disease)    Insomnia    Thyroid disease    Past Surgical History:  Procedure Laterality Date   colonoscopy     LAPAROSCOPY Right 09/08/2018   Procedure: LAPAROSCOPY DIAGNOSTIC/Possible Right Ovarian Cystectomy/Possible Ablation of Endometriosis;  Surgeon: Azucena Fallen, MD;  Location: [facility];  Service: Gynecology;  Laterality: Right;   OVARIAN CYST SURGERY     TONSILLECTOMY     UPPER GI ENDOSCOPY     wisdom teeth ext     Patient Active Problem List   Diagnosis Date Noted   Periumbilical pain 77/41/2878   OSA (obstructive sleep apnea) 07/01/2021   Residual hemorrhoidal skin tags 04/05/2021   Chronic idiopathic constipation 02/25/2021   Diverticular disease of colon 02/25/2021   Mild intermittent asthma 02/25/2021   Recurrent hematuria 11/28/2020   Bladder polyps 10/31/2020   Hypertrophy of both inferior nasal turbinates 05/29/2020   Rhinorrhea 05/29/2020   Menstrual bleeding problem 04/23/2020   Right hip pain 04/23/2020   Thyroiditis 04/23/2020   Shortness of breath 12/26/2019   Chronic sinusitis 12/02/2019   Deviated septum  12/02/2019   Hashimoto's thyroiditis 12/02/2019   Iron deficiency 05/07/2019   Mixed hyperlipidemia 05/02/2019   Moderate persistent asthma without complication 67/67/2094   Panic anxiety syndrome 04/06/2019   Breast mass in female 11/09/2018   Migraine 10/08/2018   Chronic neck pain 07/20/2018   Backache 07/12/2018   History of asthma 12/22/2017   Influenza with respiratory manifestation 12/22/2017   Flu-like symptoms 12/22/2017   Bulging lumbar disc 05/03/2017   Sore throat, chronic 10/15/2015   Celiac disease 01/01/2010   Ruptured ovarian cyst 01/01/2010   Abdominal pain, chronic, right lower quadrant 12/01/2009   Allergic rhinitis, unspecified 12/01/1998   Rationale for Evaluation and Treatment Rehabilitation   REFERRING DIAG: 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  Pt reports she has increased time for the x1 viewing exercise to 60 secs for both horizontal and vertical.  Informed pt that appt notes state that she has a 30 visit limit - and pt is receiving PT at [facility], which may be counting toward her visit limit --  pt reports she will call her insurance company this afternoon and get clarification  PAIN:  Are you having pain? Yes: NPRS scale: 5/10 Pain location: lower back, neck and head  Pain description: headache; dull ache in low back and neck soreness Aggravating factors: lights and sounds  Relieving factors: being in quiet space  Pt is receiving ortho PT for her back and neck at [place]:  NeuroRe-ed:   Gaze Stabilization:  X1 viewing exercise - in standing - on Airex with feet shoulder width apart;  5'  away with target on plain background; 30 secs x1 rep horizontal;  30 secs x 1 rep vertical standing on Airex Pt performed standing on Airex- making circles clockwise 5 reps; counterclockwise 5 reps; progress to amb. Making circles with ball CW 35' x 1 rep, then CCW direction 35' x 1 rep for improved gaze stabilization; pt then amb. Making letter "V" and "+" patterns with ball 35' x 1 rep each  Amb. Forward tossing/catching ball straight up 30' x 1, then tossing and catching on Rt/Lt sides 30' x 1;  amb. Backwards 30' tossing and catching ball with SBA  Gaze stabilization exercise - 2 letters were placed on either side of surround on Balance Master - 3 reps with 10 head turns - straight across, then "X" patterns 1 rep each; pt had most dizziness with looking down to and the Rt side  Standing on 2 pillows - trunk rotations - side to side across; diagonal up/down "X" 5 reps each direction with EO & then with EC straight across, then up/down each direction  Marching on incline - focusing on object straight ahead; then marching with eyes closed; marching with EO horizontal head turns 5 reps; vertical head turns 5 reps EO  Fast ambulation 30' x 2 reps; then added horizontal head turns during amb. 30' With supervision; amb. Fast speed 20' abrupt stop and turn x 2 reps to each side  PATIENT EDUCATION: Education details: Continue with HEP  - gaze stabilization in standing - on FLOOR  at home - time increased to 60 secs for both horizontal and vertical directions;  discussed possible aquatic PT - pt reports interest in this service Person educated: Patient Education method: Explanation and Verbal cues Education comprehension: verbalized understanding and needs further education   Home Exercise Program:  Access Code: [email or web address] URL: [email or web address] Date: 04/21/2022 Prepared by: Cameron Sprang  Exercises - Romberg Stance on Foam Pad  - 1 x daily - 7 x weekly - 1 sets - 3 reps - 20 secs  hold - Romberg Stance on Foam Pad with Head Rotation  - 1 x daily - 7 x weekly - 2 sets - 2 reps - Romberg Stance with Head Nods on Foam Pad  - 1 x daily - 7 x weekly - 2 sets - 5 reps - Romberg Stance Eyes Closed on Foam Pad  - 1 x daily - 7 x weekly - 1 sets - 3 reps - 20 secs hold - Standing Gaze Stabilization with Head Nod  - 1 x daily - 7 x weekly - 1 sets - 2 reps - 30 secs hold - Standing Gaze Stabilization with Head Rotation  - 1 x daily - 7 x weekly - 1 sets - 2 reps - 30 secs hold - Standing on Foam Gaze Stabilization with Two Near Targets and Head Rotation  - 1 x daily - 7 x weekly - 1 sets - 10 reps - Standing on  Foam Gaze Stabilization with Two Near Targets and Head Nod  - 1 x daily - 7 x weekly - 1 sets - 10 reps   GOALS: Goals reviewed with patient? Yes   SHORT TERM GOALS: Target date: 04/30/2022   Pt will be IND with independent HEP in order to indicate improved functional mobility and dec fall risk. Baseline: reports independence with current HEP Goal status: MET   2.  Pt will improve gait speed to 3.0 ft/sec in order to indicate dec fall risk and improved efficiency of gait.    Baseline: 2.7 ft/sec; 3.01 ft/sec Goal status: MET   3.  Pt will negotiate up/down 4 steps without rail, up/down ramp and curb and ambulate x 1000' outdoors over varying surfaces at S level in order to indicate improved community mobility.    Baseline: CGA without use of rail, Mod I with gait, curb and ramp. Goal status: PARTIALLY MET   4.  Pt will complete [facility] post concussive questionnaire and LTG to be added to reflect progress.  Baseline: RPQ-3: 9, RPQ-13:36 Goal status: met    5.  Pt will complete SOT and STG to be updated and LTG to be set to reflect progress.  Baseline: Assessed on 04/15/22 Goal status: MET       LONG TERM GOALS: Target date: 05/28/2022   Pt will be IND with final HEP in order to indicate improved functional mobility and dec fall risk.  Baseline:  Goal  status: INITIAL   2.  Pt will improve gait speed to >/=3.6 ft/sec in order to indicate dec fall risk.   Baseline:  Goal status: INITIAL   3.  Pt will negotiate up/down 12 steps without rail, up/down ramp and curb and ambulate x 1000' outdoors over varying surfaces while scanning environment at mod I level in order to indicate improved community mobility.    Baseline:  Goal status: INITIAL   4.  Pt will tolerate going grocery shopping/out to eat (social outings) with no more than 2-3 point increase in symptoms.  Baseline:  Goal status: INITIAL   5.  SOT goal to be set upon assessment.  Baseline:  Goal status: INITIAL  6. Pt will improve [facility] Post Concussion Symptoms Questionnaire to </=25 in order to demonstrate less debility with everyday tasks.    Baseline: See above  Goal Status: Initial        ASSESSMENT:   CLINICAL IMPRESSION: Pt able to perform x1 viewing in standing on Airex for 30 secs  both horizontal and vertical directions in today's session.  Pt reported increased dizziness provoked with vertical head turns than with horizontal head turns.  Pt reported most difficulty with looking down & to the Rt side with gaze stabilization exercise with target placed on busy visual surround of balance master.  Pt also reported more difficulty looking at letter in black ink compared to looking at the letter written in pink ink on yellow post it note.   Will continue per POC. Will continue to progress toward all LTGs.      OBJECTIVE IMPAIRMENTS Abnormal gait, decreased activity tolerance, decreased balance, decreased coordination, decreased mobility, dizziness, and impaired perceived functional ability.    ACTIVITY LIMITATIONS cleaning, community activity, driving, occupation, and shopping.    PERSONAL FACTORS Time since onset of injury/illness/exacerbation and 1-2 comorbidities: see above  are also affecting patient's functional outcome.      REHAB POTENTIAL: Excellent    CLINICAL DECISION MAKING: Evolving/moderate complexity   EVALUATION COMPLEXITY: Moderate  PLAN: PT FREQUENCY: 2x/week   PT DURATION: 8 weeks   PLANNED INTERVENTIONS: Therapeutic exercises, Therapeutic activity, Neuromuscular re-education, Balance training, Gait training, Patient/Family education, Vestibular training, Canalith repositioning, and Visual/preceptual remediation/compensation   PLAN FOR NEXT SESSION: Make sure to not do cervical or LBP as they are getting PT at another clinic for this. Pt is checking insurance for clarification of visit limit (30?) - may have used limit at this time if combined with ortho PT.  Continue Gaze stabilization. Saccades. Visual Tracking. Dual Tasking and High Level Balance.      Guido Sander, [place] [address] [place], [place], [address] Phone: [phone]   Fax:  [phone] 05/01/22, 3:24 PM

## 2022-05-02 ENCOUNTER — Ambulatory Visit: Payer: Commercial Managed Care - HMO

## 2022-05-02 DIAGNOSIS — R2681 Unsteadiness on feet: Secondary | ICD-10-CM | POA: Diagnosis not present

## 2022-05-02 DIAGNOSIS — R41841 Cognitive communication deficit: Secondary | ICD-10-CM

## 2022-05-02 NOTE — Patient Instructions (Signed)
[employer] Your [employer] - yoga for brain injury
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
## 2022-05-06 ENCOUNTER — Encounter: Payer: Self-pay | Admitting: Rehabilitation

## 2022-05-06 ENCOUNTER — Ambulatory Visit: Payer: Commercial Managed Care - HMO | Admitting: Rehabilitation

## 2022-05-06 ENCOUNTER — Ambulatory Visit: Payer: Commercial Managed Care - HMO | Admitting: Speech Pathology

## 2022-05-06 DIAGNOSIS — R2681 Unsteadiness on feet: Secondary | ICD-10-CM | POA: Diagnosis not present

## 2022-05-06 DIAGNOSIS — R41841 Cognitive communication deficit: Secondary | ICD-10-CM

## 2022-05-06 DIAGNOSIS — R293 Abnormal posture: Secondary | ICD-10-CM

## 2022-05-06 DIAGNOSIS — R42 Dizziness and giddiness: Secondary | ICD-10-CM

## 2022-05-06 NOTE — Patient Instructions (Signed)
Urgent Not urgent  Important DO DECIDE  Not Important DELEGATE DELETE / DELAY   URGENCY: Time sensitive. Ask yourself, does this need to be done today or can it wait.   IMPORTANCE: How does this align with my priorities.   Note that the same item on to-do list may change places depending on due date/ deadline, where your head is at, what is important to you in that day, etc.
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
## 2022-05-08 ENCOUNTER — Ambulatory Visit: Payer: Commercial Managed Care - HMO | Admitting: Speech Pathology

## 2022-05-08 ENCOUNTER — Ambulatory Visit: Payer: Commercial Managed Care - HMO

## 2022-05-08 DIAGNOSIS — R2681 Unsteadiness on feet: Secondary | ICD-10-CM | POA: Diagnosis not present

## 2022-05-08 DIAGNOSIS — R42 Dizziness and giddiness: Secondary | ICD-10-CM

## 2022-05-08 DIAGNOSIS — R41841 Cognitive communication deficit: Secondary | ICD-10-CM

## 2022-05-08 DIAGNOSIS — R293 Abnormal posture: Secondary | ICD-10-CM
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
## 2022-05-12 ENCOUNTER — Other Ambulatory Visit: Payer: Self-pay | Admitting: Internal Medicine

## 2022-05-12 DIAGNOSIS — M546 Pain in thoracic spine: Secondary | ICD-10-CM

## 2022-05-13 ENCOUNTER — Ambulatory Visit: Payer: Commercial Managed Care - HMO

## 2022-05-13 DIAGNOSIS — R41841 Cognitive communication deficit: Secondary | ICD-10-CM

## 2022-05-13 DIAGNOSIS — R2681 Unsteadiness on feet: Secondary | ICD-10-CM | POA: Diagnosis not present

## 2022-05-13 DIAGNOSIS — R42 Dizziness and giddiness: Secondary | ICD-10-CM
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
## 2022-05-16 ENCOUNTER — Ambulatory Visit: Payer: Commercial Managed Care - HMO | Admitting: Speech Pathology

## 2022-05-16 ENCOUNTER — Ambulatory Visit: Payer: Commercial Managed Care - HMO

## 2022-05-20 ENCOUNTER — Ambulatory Visit: Payer: Commercial Managed Care - HMO

## 2022-05-22 ENCOUNTER — Encounter: Payer: Self-pay | Admitting: Physical Therapy

## 2022-05-22 ENCOUNTER — Ambulatory Visit: Payer: Commercial Managed Care - HMO | Admitting: Physical Therapy

## 2022-05-22 DIAGNOSIS — R2681 Unsteadiness on feet: Secondary | ICD-10-CM | POA: Diagnosis not present

## 2022-05-22 DIAGNOSIS — R42 Dizziness and giddiness: Secondary | ICD-10-CM
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
## 2022-05-22 NOTE — Patient Instructions (Signed)
Gaze Stabilization: Standing Feet Apart    Target on wall __6__ feet away, tilt head down 15-30 and move head side to side for _60___ seconds. Repeat while moving head up and down for __60__ seconds. Do __3__ sessions per day.  PLAIN BACKGROUND   Repeat using target on pattern background.  Start with 15 - 20 secs - at a different time than when you do the exercise on a plain background

## 2022-05-26 ENCOUNTER — Other Ambulatory Visit: Payer: Commercial Managed Care - HMO

## 2022-05-28 ENCOUNTER — Ambulatory Visit: Payer: Commercial Managed Care - HMO | Admitting: Physical Therapy

## 2022-05-28 DIAGNOSIS — R42 Dizziness and giddiness: Secondary | ICD-10-CM

## 2022-05-28 DIAGNOSIS — R2681 Unsteadiness on feet: Secondary | ICD-10-CM | POA: Diagnosis not present

## 2022-05-29 ENCOUNTER — Encounter: Payer: Self-pay | Admitting: Physical Therapy
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
## 2022-05-30 ENCOUNTER — Ambulatory Visit: Payer: Commercial Managed Care - HMO | Admitting: Speech Pathology

## 2022-05-30 DIAGNOSIS — R41841 Cognitive communication deficit: Secondary | ICD-10-CM

## 2022-05-30 DIAGNOSIS — R2681 Unsteadiness on feet: Secondary | ICD-10-CM | POA: Diagnosis not present
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
## 2022-06-06 ENCOUNTER — Ambulatory Visit: Payer: Commercial Managed Care - HMO

## 2022-06-06 ENCOUNTER — Ambulatory Visit: Payer: Commercial Managed Care - HMO | Attending: Internal Medicine | Admitting: Speech Pathology

## 2022-06-06 DIAGNOSIS — R42 Dizziness and giddiness: Secondary | ICD-10-CM | POA: Insufficient documentation

## 2022-06-06 DIAGNOSIS — R41841 Cognitive communication deficit: Secondary | ICD-10-CM | POA: Insufficient documentation

## 2022-06-06 DIAGNOSIS — R2681 Unsteadiness on feet: Secondary | ICD-10-CM | POA: Insufficient documentation
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
## 2022-06-11 ENCOUNTER — Ambulatory Visit: Payer: Commercial Managed Care - HMO | Admitting: Physical Therapy

## 2022-06-11 DIAGNOSIS — R42 Dizziness and giddiness: Secondary | ICD-10-CM

## 2022-06-11 DIAGNOSIS — R41841 Cognitive communication deficit: Secondary | ICD-10-CM | POA: Diagnosis not present

## 2022-06-11 DIAGNOSIS — R2681 Unsteadiness on feet: Secondary | ICD-10-CM

## 2022-06-12 ENCOUNTER — Encounter: Payer: Self-pay | Admitting: Physical Therapy

## 2022-06-12 ENCOUNTER — Ambulatory Visit: Payer: Commercial Managed Care - HMO | Admitting: Speech Pathology
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
## 2022-06-17 ENCOUNTER — Ambulatory Visit: Payer: Commercial Managed Care - HMO | Admitting: Physical Therapy

## 2022-07-04 ENCOUNTER — Ambulatory Visit: Payer: Managed Care, Other (non HMO) | Admitting: Internal Medicine

## 2022-07-25 ENCOUNTER — Other Ambulatory Visit: Payer: Self-pay | Admitting: Neurology

## 2022-07-25 DIAGNOSIS — G35 Multiple sclerosis: Secondary | ICD-10-CM

## 2022-08-08 ENCOUNTER — Encounter: Payer: Self-pay | Admitting: Neurology

## 2022-08-12 ENCOUNTER — Ambulatory Visit
Admission: RE | Admit: 2022-08-12 | Discharge: 2022-08-12 | Disposition: A | Discharge status: Home / Self Care | Payer: Commercial Managed Care - HMO | Source: Ambulatory Visit | Attending: Neurology | Admitting: Neurology

## 2022-08-12 DIAGNOSIS — G35 Multiple sclerosis: Secondary | ICD-10-CM

## 2022-08-19 ENCOUNTER — Ambulatory Visit: Payer: Managed Care, Other (non HMO) | Admitting: Neurology

## 2022-08-26 ENCOUNTER — Telehealth: Payer: Self-pay | Admitting: Gastroenterology

## 2022-08-26 NOTE — Telephone Encounter (Signed)
Hi Dr. Loletha Carrow,  [place] 05/07/22.  We received a referral for patient to be seen patient has GI history with [facility] records are in Epic for you to review. She is requesting a transfer of care because they are no longer taking her insurance.  Please advise on scheduling. Thanks

## 2022-08-28 NOTE — Telephone Encounter (Signed)
Thank you for the note.  Yes, they can be given the next available new patient appointment with me.  - HD

## 2022-09-03 ENCOUNTER — Ambulatory Visit (INDEPENDENT_AMBULATORY_CARE_PROVIDER_SITE_OTHER): Payer: Commercial Managed Care - HMO | Admitting: Allergy

## 2022-09-03 ENCOUNTER — Encounter: Payer: Self-pay | Admitting: Allergy

## 2022-09-03 VITALS — BP 100/72 | HR 85 | Temp 98.2°F | Resp 20 | Ht 69.0 in | Wt 186.0 lb

## 2022-09-03 DIAGNOSIS — L309 Dermatitis, unspecified: Secondary | ICD-10-CM

## 2022-09-03 DIAGNOSIS — T781XXD Other adverse food reactions, not elsewhere classified, subsequent encounter: Secondary | ICD-10-CM

## 2022-09-03 DIAGNOSIS — T7800XD Anaphylactic reaction due to unspecified food, subsequent encounter: Secondary | ICD-10-CM

## 2022-09-03 DIAGNOSIS — J4520 Mild intermittent asthma, uncomplicated: Secondary | ICD-10-CM | POA: Diagnosis not present

## 2022-09-03 DIAGNOSIS — J3089 Other allergic rhinitis: Secondary | ICD-10-CM

## 2022-09-03 DIAGNOSIS — J329 Chronic sinusitis, unspecified: Secondary | ICD-10-CM
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
## 2022-09-05 LAB — ALLERGEN, WHEAT, F4: Wheat IgE: 0.46 kU/L — AB

## 2022-09-05 MED ORDER — CARBINOXAMINE MALEATE 4 MG PO TABS: 8.0000 mg | ORAL_TABLET | Freq: Two times a day (BID) | ORAL | 5 refills | Status: AC

## 2022-09-05 MED ORDER — PIMECROLIMUS 1 % EX CREA: TOPICAL_CREAM | Freq: Two times a day (BID) | CUTANEOUS | 0 refills | Status: DC

## 2022-09-05 NOTE — Patient Instructions (Addendum)
-  Will obtain immunocompetence work-up for her recurrent sinus infections which includes CBC, CMP, immunoglobulin levels, vaccine titers -We will obtain environmental allergy panel to help assess if she allergic to allergens like mold or pollen etc. -Recommended trial of nasal antihistamine Astepro which is over-the-counter.  Can use 2 sprays each nostril twice a day for drainage control.  I believe this is a nasal spray that she has not tried today. -Can try antihistamine like carbinoxamine 8mg  twice a day as needed to see if she responds to this antihistamine -We will obtain wheat IgE to see if she may have a food allergy to wheat products -Have access to albuterol inhaler 2 puffs every 4-6 hours as needed for cough/wheeze/shortness of breath/chest tightness.  May use 15-20 minutes prior to activity.   Monitor frequency of use.   -Can try Elidel, non-steroid ointment, twice a day as needed for itchy, red, irritated areas on the face.  Nonsteroid ointments are safe to use anywhere on the body as needed.  Follow-up in 3 to 4 months or sooner if needed

## 2022-09-10 LAB — STREP PNEUMONIAE 23 SEROTYPES IGG
Pneumo Ab Type 1*: 0.3 ug/mL — ABNORMAL LOW (ref >1.3)
Pneumo Ab Type 12 (12F)*: 0.2 ug/mL — ABNORMAL LOW (ref >1.3)
Pneumo Ab Type 14*: 9 ug/mL (ref >1.3)
Pneumo Ab Type 17 (17F)*: 0.3 ug/mL — ABNORMAL LOW (ref >1.3)
Pneumo Ab Type 19 (19F)*: 2.8 ug/mL (ref >1.3)
Pneumo Ab Type 2*: 1.1 ug/mL — ABNORMAL LOW (ref >1.3)
Pneumo Ab Type 20*: 1.2 ug/mL — ABNORMAL LOW (ref >1.3)
Pneumo Ab Type 22 (22F)*: 0.9 ug/mL — ABNORMAL LOW (ref >1.3)
Pneumo Ab Type 23 (23F)*: 1.3 ug/mL — ABNORMAL LOW (ref >1.3)
Pneumo Ab Type 26 (6B)*: 0.8 ug/mL — ABNORMAL LOW (ref >1.3)
Pneumo Ab Type 3*: 0.3 ug/mL — ABNORMAL LOW (ref >1.3)
Pneumo Ab Type 34 (10A)*: 0.7 ug/mL — ABNORMAL LOW (ref >1.3)
Pneumo Ab Type 4*: 0.3 ug/mL — ABNORMAL LOW (ref >1.3)
Pneumo Ab Type 43 (11A)*: 0.2 ug/mL — ABNORMAL LOW (ref >1.3)
Pneumo Ab Type 5*: 1.7 ug/mL (ref >1.3)
Pneumo Ab Type 51 (7F)*: 1 ug/mL — ABNORMAL LOW (ref >1.3)
Pneumo Ab Type 54 (15B)*: 0.8 ug/mL — ABNORMAL LOW (ref >1.3)
Pneumo Ab Type 56 (18C)*: 0.4 ug/mL — ABNORMAL LOW (ref >1.3)
Pneumo Ab Type 57 (19A)*: 0.4 ug/mL — ABNORMAL LOW (ref >1.3)
Pneumo Ab Type 68 (9V)*: 0.2 ug/mL — ABNORMAL LOW (ref >1.3)
Pneumo Ab Type 70 (33F)*: 0.6 ug/mL — ABNORMAL LOW (ref >1.3)
Pneumo Ab Type 8*: 0.6 ug/mL — ABNORMAL LOW (ref >1.3)
Pneumo Ab Type 9 (9N)*: 0.1 ug/mL — ABNORMAL LOW (ref >1.3)

## 2022-09-10 LAB — ALLERGENS W/TOTAL IGE AREA 2
Alternaria Alternata IgE: <0.1 kU/L
Aspergillus Fumigatus IgE: <0.1 kU/L
Bermuda Grass IgE: <0.1 kU/L
Cat Dander IgE: <0.1 kU/L
Cedar, Mountain IgE: <0.1 kU/L
Cladosporium Herbarum IgE: 0.19 kU/L — AB
Cockroach, German IgE: <0.1 kU/L
Common Silver Birch IgE: <0.1 kU/L
Cottonwood IgE: <0.1 kU/L
D Farinae IgE: 11.3 kU/L — AB
D Pteronyssinus IgE: 8.8 kU/L — AB
Dog Dander IgE: <0.1 kU/L
Elm, American IgE: <0.1 kU/L
IgE (Immunoglobulin E), Serum: 75 IU/mL (ref 6–495)
Johnson Grass IgE: <0.1 kU/L
Maple/Box Elder IgE: <0.1 kU/L
Mouse Urine IgE: <0.1 kU/L
Oak, White IgE: <0.1 kU/L
Pecan, Hickory IgE: <0.1 kU/L
Penicillium Chrysogen IgE: <0.1 kU/L
Pigweed, Rough IgE: <0.1 kU/L
Ragweed, Short IgE: <0.1 kU/L
Sheep Sorrel IgE Qn: <0.1 kU/L
Timothy Grass IgE: 0.37 kU/L — AB
White Mulberry IgE: <0.1 kU/L

## 2022-09-10 LAB — CBC WITH DIFFERENTIAL
Basophils Absolute: 0.1 10*3/uL (ref 0.0–0.2)
Basos: 1 %
EOS (ABSOLUTE): 0.3 10*3/uL (ref 0.0–0.4)
Eos: 5 %
Hematocrit: 42 % (ref 34.0–46.6)
Hemoglobin: 14.5 g/dL (ref 11.1–15.9)
Immature Grans (Abs): 0 10*3/uL (ref 0.0–0.1)
Immature Granulocytes: 0 %
Lymphocytes Absolute: 2.1 10*3/uL (ref 0.7–3.1)
Lymphs: 38 %
MCH: 32.1 pg (ref 26.6–33.0)
MCHC: 34.5 g/dL (ref 31.5–35.7)
MCV: 93 fL (ref 79–97)
Monocytes Absolute: 0.4 10*3/uL (ref 0.1–0.9)
Monocytes: 8 %
Neutrophils Absolute: 2.8 10*3/uL (ref 1.4–7.0)
Neutrophils: 48 %
RBC: 4.52 x10E6/uL (ref 3.77–5.28)
RDW: 12.1 % (ref 11.7–15.4)
WBC: 5.7 10*3/uL (ref 3.4–10.8)

## 2022-09-10 LAB — COMPREHENSIVE METABOLIC PANEL
ALT: 16 IU/L (ref 0–32)
AST: 18 IU/L (ref 0–40)
Albumin/Globulin Ratio: 2.1 (ref 1.2–2.2)
Albumin: 5 g/dL — ABNORMAL HIGH (ref 3.9–4.9)
Alkaline Phosphatase: 70 IU/L (ref 44–121)
BUN/Creatinine Ratio: 13 (ref 9–23)
BUN: 8 mg/dL (ref 6–20)
Bilirubin Total: 0.2 mg/dL (ref 0.0–1.2)
CO2: 24 mmol/L (ref 20–29)
Calcium: 10.1 mg/dL (ref 8.7–10.2)
Chloride: 101 mmol/L (ref 96–106)
Creatinine, Ser: 0.64 mg/dL (ref 0.57–1.00)
Globulin, Total: 2.4 g/dL (ref 1.5–4.5)
Glucose: 95 mg/dL (ref 70–99)
Potassium: 4.4 mmol/L (ref 3.5–5.2)
Sodium: 137 mmol/L (ref 134–144)
Total Protein: 7.4 g/dL (ref 6.0–8.5)
eGFR: 119 mL/min/{1.73_m2} (ref >59)

## 2022-09-10 LAB — DIPHTHERIA / TETANUS ANTIBODY PANEL
Diphtheria Ab: 0.43 IU/mL (ref <0.10)
Tetanus Ab, IgG: 0.63 IU/mL (ref <0.10)

## 2022-09-10 LAB — IGG, IGA, IGM
IgA/Immunoglobulin A, Serum: 159 mg/dL (ref 87–352)
IgG (Immunoglobin G), Serum: 1085 mg/dL (ref 586–1602)
IgM (Immunoglobulin M), Srm: 104 mg/dL (ref 26–217)

## 2022-09-10 LAB — COMPLEMENT, TOTAL: Compl, Total (CH50): 60 U/mL (ref >41)

## 2022-09-16 NOTE — Telephone Encounter (Signed)
Called patient to schedule left voicemail. 

## 2022-09-23 ENCOUNTER — Encounter: Payer: Self-pay | Admitting: Gastroenterology

## 2022-09-23 NOTE — Telephone Encounter (Signed)
Patient is schedule for 12/12 for an OV.

## 2022-10-26 ENCOUNTER — Encounter: Payer: Self-pay | Admitting: Allergy

## 2022-10-27 NOTE — Telephone Encounter (Signed)
Is it ok to take Carbinoxamine and Clonazepam together?

## 2022-11-04 ENCOUNTER — Encounter: Payer: Commercial Managed Care - HMO | Attending: Psychology | Admitting: Psychology

## 2022-11-04 DIAGNOSIS — F0781 Postconcussional syndrome: Secondary | ICD-10-CM

## 2022-11-04 DIAGNOSIS — G44319 Acute post-traumatic headache, not intractable: Secondary | ICD-10-CM | POA: Diagnosis not present

## 2022-11-04 DIAGNOSIS — F4310 Post-traumatic stress disorder, unspecified: Secondary | ICD-10-CM | POA: Diagnosis present

## 2022-11-05 ENCOUNTER — Encounter: Payer: Self-pay | Admitting: Psychology
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
## 2022-11-11 ENCOUNTER — Ambulatory Visit: Payer: Commercial Managed Care - HMO | Admitting: Gastroenterology

## 2022-11-18 DIAGNOSIS — F0781 Postconcussional syndrome: Secondary | ICD-10-CM

## 2022-11-20 DIAGNOSIS — F0781 Postconcussional syndrome: Secondary | ICD-10-CM | POA: Diagnosis not present

## 2022-11-20 NOTE — Progress Notes (Signed)
   Behavioral Observations The patient appeared well-groomed and appropriately dressed and displayed manners that were polite and appropriate to the situation. The patient's attitude toward testing was positive and good effort was shown. The patient reported experiencing some nausea throughout the testing and breaks were taken.  Neuropsychology Note  Victoria Larsen completed 120 minutes of neuropsychological testing with technician, Marica Otter, BA, under the supervision of Arley Phenix, PsyD., Clinical Neuropsychologist. The patient did not appear overtly distressed by the testing session, per behavioral observation or via self-report to the technician. Rest breaks were offered.   Clinical Decision Making: In considering the patient's current level of functioning, level of presumed impairment, nature of symptoms, emotional and behavioral responses during clinical interview, level of literacy, and observed level of motivation/effort, a battery of tests was selected by Dr. Kieth Brightly during initial consultation on 11/04/2022. This was communicated to the technician. Communication between the neuropsychologist and technician was ongoing throughout the testing session and changes were made as deemed necessary based on patient performance on testing, technician observations and additional pertinent factors such as those listed above.  Tests Administered: Comprehensive Attention Battery (CAB) Continuous Performance Test (CPT) Delis Arlyce Dice Executive Functioning System (D-KEFS), (Trail Making Test, Verbal Fluency, Design Fluency, Color-Word Inference Test)    Results: Will be included in final report   Feedback to Patient: Victoria Larsen will return on 05/12/2023 for an interactive feedback session with Dr. Kieth Brightly at which time Victoria Larsen "[name]"'s test performances, clinical impressions and treatment recommendations will be reviewed in detail. The patient understands Victoria Larsen "[name]" can contact our office should Victoria Larsen "[name]" require our assistance before this time.  120 minutes spent face-to-face with patient administering standardized tests, 30 minutes spent scoring Radiographer, therapeutic). [CPT [id number], [id number]]  Full report to follow.

## 2022-11-20 NOTE — Progress Notes (Signed)
   Behavioral Observations The patient appeared well-groomed and appropriately dressed. The patient's attitude toward testing was positive and effort was good.The patient reported some nausea occurring throughout the test and breaks were taken.  Neuropsychology Note  Victoria Larsen completed 240 minutes of neuropsychological testing with technician, Victoria Larsen, BA, under the supervision of Arley Phenix, PsyD., Clinical Neuropsychologist. The patient did not appear overtly distressed by the testing session, per behavioral observation or via self-report to the technician. Rest breaks were offered.   Clinical Decision Making: In considering the patient's current level of functioning, level of presumed impairment, nature of symptoms, emotional and behavioral responses during clinical interview, level of literacy, and observed level of motivation/effort, a battery of tests was selected by Dr. Kieth Brightly during initial consultation on 11/04/2022. This was communicated to the technician. Communication between the neuropsychologist and technician was ongoing throughout the testing session and changes were made as deemed necessary based on patient performance on testing, technician observations and additional pertinent factors such as those listed above.  Tests Administered: Wechsler Adult Intelligence Scale, 4th Edition (WAIS-IV) Wechsler Memory Scale, 4th Edition (WMS-IV); Adult Battery  Results: Will be included in final report   Feedback to Patient: Victoria Larsen will return on 05/12/2023 for an interactive feedback session with Dr. Kieth Brightly at which time Victoria Larsen "[name]"'s test performances, clinical impressions and treatment recommendations will be reviewed in detail. The patient understands Victoria Larsen "[name]" can contact our office should Victoria Larsen "[name]" require our assistance before this time.  240 minutes spent face-to-face with patient administering  standardized tests, 30 minutes spent scoring Radiographer, therapeutic). [CPT [id number], [id number]]  Full report to follow.

## 2022-12-05 ENCOUNTER — Other Ambulatory Visit: Payer: Self-pay | Admitting: *Deleted

## 2022-12-05 DIAGNOSIS — I8393 Asymptomatic varicose veins of bilateral lower extremities: Secondary | ICD-10-CM

## 2022-12-18 ENCOUNTER — Ambulatory Visit: Payer: 59 | Admitting: Physician Assistant

## 2022-12-18 ENCOUNTER — Ambulatory Visit (HOSPITAL_COMMUNITY)
Admission: RE | Admit: 2022-12-18 | Discharge: 2022-12-18 | Disposition: A | Discharge status: Home / Self Care | Payer: 59 | Source: Ambulatory Visit | Attending: Physician Assistant | Admitting: Physician Assistant

## 2022-12-18 VITALS — BP 106/54 | HR 70 | Temp 98.2°F | Resp 20 | Ht 69.0 in | Wt 171.6 lb

## 2022-12-18 DIAGNOSIS — I8393 Asymptomatic varicose veins of bilateral lower extremities: Secondary | ICD-10-CM | POA: Diagnosis not present

## 2022-12-18 DIAGNOSIS — I83811 Varicose veins of right lower extremities with pain: Secondary | ICD-10-CM

## 2022-12-18 NOTE — Progress Notes (Signed)
Requested by:  Collene Mares, PA [address] [place] [address] [place],  [place] [address]  Reason for consultation: painful vein on right leg    History of Present Illness   Victoria Larsen is a 37 y.o. ([birth year]) adult who presents for evaluation of a painful vein on the right calf.  The patient states this vein has been present since 2020.  It will intermittently engorged and cause a throbbing or burning pain.  There seems to be no alleviating or aggravating factors currently.  The patient denies any history of leg swelling, bleeding, ulceration, previous vascular procedures, or history of DVT.  The patient states many of her family numbers deal with painful veins like this.  The patient has not attempted elevation before, and previous compression stockings were too tight.  Past Medical History:  Diagnosis Date   Allergy    Anemia    Anxiety    Asthma    Bulging lumbar disc    Chronic neck pain    Concussion    Fibromyalgia    GERD (gastroesophageal reflux disease)    Hashimoto's disease    Insomnia    Thyroid disease     Past Surgical History:  Procedure Laterality Date   colonoscopy     LAPAROSCOPY Right 09/08/2018   Procedure: LAPAROSCOPY DIAGNOSTIC/Possible Right Ovarian Cystectomy/Possible Ablation of Endometriosis;  Surgeon: Shea Evans, MD;  Location: [facility];  Service: Gynecology;  Laterality: Right;   OVARIAN CYST SURGERY     TONSILLECTOMY     UPPER GI ENDOSCOPY     wisdom teeth ext      Social History   Socioeconomic History   Marital status: Single    Spouse name: Not on file   Number of children: Not on file   Years of education: Not on file   Highest education level: Not on file  Occupational History   Not on file  Tobacco Use   Smoking status: Former    Packs/day: 0.50    Years: 3.00    Total pack years: 1.50    Types: Cigarettes    Quit date: 2017    Years since quitting: 7.0    Passive exposure: Never   Smokeless tobacco: Never   Vaping Use   Vaping Use: Never used  Substance and Sexual Activity   Alcohol use: No   Drug use: No   Sexual activity: Yes    Birth control/protection: None    Comment: Same sex partner  Other Topics Concern   Not on file  Social History Narrative   Marital status: single; dating same sexual partner casually.  Children: none.  Employment: Landscaping.  Tobacco: none  Alcohol: none Drugs: none.  Sexually active: yes.   Social Determinants of Health   Financial Resource Strain: Not on file  Food Insecurity: Not on file  Transportation Needs: Not on file  Physical Activity: Not on file  Stress: Not on file  Social Connections: Not on file  Intimate Partner Violence: Not on file    Family History  Problem Relation Age of Onset   Allergic rhinitis Mother    Eczema Mother    Asthma Mother    Allergic rhinitis Sister    Sinusitis Sister    Food Allergy Sister    Migraines Sister    Allergic rhinitis Maternal Aunt    Breast cancer Maternal Aunt    Heart disease Maternal Grandmother    Asthma Paternal Grandmother    Stroke Paternal Grandfather  Immunodeficiency Neg Hx    Urticaria Neg Hx    Angioedema Neg Hx    Atopy Neg Hx     Current Outpatient Medications  Medication Sig Dispense Refill   albuterol (PROVENTIL HFA;VENTOLIN HFA) 108 (90 Base) MCG/ACT inhaler Inhale 1-2 puffs into the lungs every 6 (six) hours as needed for shortness of breath. 1 Inhaler 0   azelaic acid (AZELEX) 20 % cream Apply topically 2 (two) times daily. After skin is thoroughly washed and patted dry, gently but thoroughly massage a thin film of azelaic acid cream into the affected area twice daily, in the morning and evening.     B Complex Vitamins (VITAMIN B COMPLEX PO) Take by mouth.     Carbinoxamine Maleate 4 MG TABS Take 2 tablets (8 mg total) by mouth 2 (two) times daily. 120 tablet 5   Fish Oil-Cholecalciferol (OMEGA-3 FISH OIL-VITAMIN D3) 1200-1000 MG-UNIT CAPS      levothyroxine  (SYNTHROID) 25 MCG tablet Take 25 mcg by mouth daily before breakfast.     pimecrolimus (ELIDEL) 1 % cream Apply topically 2 (two) times daily. 30 g 0   UNABLE TO FIND Take 1 Scoop by mouth daily. Med Name: creatine 5 mg (powder)     No current facility-administered medications for this visit.    Allergies  Allergen Reactions   Vicodin [Hydrocodone-Acetaminophen] Nausea And Vomiting   Clindamycin/Lincomycin    Metronidazole    Wheat Bran Other (See Comments)   Hydrocodone Nausea Only    Other reaction(s): Abdominal Pain, GI Upset (intolerance)   Penicillins Rash    Infant Has patient had a PCN reaction causing immediate rash, facial/tongue/throat swelling, SOB or lightheadedness with hypotension: yes Has patient had a PCN reaction causing severe rash involving mucus membranes or skin necrosis: no Has patient had a PCN reaction that required hospitalization: no Has patient had a PCN reaction occurring within the last 10 years: no If all of the above answers are "NO", then may proceed with Cephalosporin use.     REVIEW OF SYSTEMS (negative unless checked):   Cardiac:  []  Chest pain or chest pressure? []  Shortness of breath upon activity? []  Shortness of breath when lying flat? []  Irregular heart rhythm?  Vascular:  []  Pain in calf, thigh, or hip brought on by walking? []  Pain in feet at night that wakes you up from your sleep? []  Blood clot in your veins? []  Leg swelling?  Pulmonary:  []  Oxygen at home? []  Productive cough? []  Wheezing?  Neurologic:  []  Sudden weakness in arms or legs? []  Sudden numbness in arms or legs? []  Sudden onset of difficult speaking or slurred speech? []  Temporary loss of vision in one eye? []  Problems with dizziness?  Gastrointestinal:  []  Blood in stool? []  Vomited blood?  Genitourinary:  []  Burning when urinating? []  Blood in urine?  Psychiatric:  []  Major depression  Hematologic:  []  Bleeding problems? []  Problems with blood  clotting?  Dermatologic:  []  Rashes or ulcers?  Constitutional:  []  Fever or chills?  Ear/Nose/Throat:  []  Change in hearing? []  Nose bleeds? []  Sore throat?  Musculoskeletal:  []  Back pain? []  Joint pain? []  Muscle pain?   Physical Examination     Vitals:   12/18/22 1427  BP: (!) 106/54  Pulse: 70  Resp: 20  Temp: 98.2 F (36.8 C)  TempSrc: Temporal  SpO2: 98%  Weight: 171 lb 9.6 oz (77.8 kg)  Height: 5\' 9"  (1.753 m)   Body mass index is 25.34  kg/m.  General:  WDWN in NAD; vital signs documented above Gait: Not observed HENT: WNL, normocephalic Pulmonary: normal non-labored breathing Cardiac: Regular rate and rhythm Abdomen: soft, NT, no masses Skin: without rashes Vascular Exam/Pulses: 2+ DP pulses bilaterally Extremities: without varicose veins, with 2 reticular veins along right medial calf, without edema, without stasis pigmentation, without lipodermatosclerosis, without ulcers Musculoskeletal: no muscle wasting or atrophy  Neurologic: A&O X 3;  No focal weakness or paresthesias are detected Psychiatric:  The pt has Normal affect.  Non-invasive Vascular Imaging   RLE Venous Insufficiency Duplex (12/18/2022):  - No evidence of deep vein thrombosis seen in the right lower extremity,  from the common femoral through the popliteal veins.  - No evidence of superficial venous thrombosis in the right lower  extremity.    - No evidence of superficial venous reflux seen in the right short  saphenous vein.    - Venous reflux is noted in the right common femoral vein.  - Venous reflux is noted in the right sapheno-femoral junction.  - Venous reflux is noted in the right greater saphenous vein in the thigh.  - Venous reflux is noted in the right greater saphenous vein in the calf.    Medical Decision Making   Victoria Larsen is a 37 y.o. 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  There is also reflux in the greater saphenous vein at the knee and proximal calf.  The rest of the deep and superficial venous system is competent.  There are no signs of DVT or SVT.  Due to the size of the GSV in the thigh, the patient would not be a candidate for laser ablation The patient has dealt with intermittently engorging and painful reticular/varicose veins in the right medial calf.  There are no specific alleviating or aggravating factors.  Compression therapy has previously been attempted, however the patient did not get the right size. The patient denies a history of lower extremity swelling I believe the patient's intermittent vein engorgement and pain would be best treated with conservative therapy, including compression stockings, leg elevation, and increased exercise. The patient has been measured for 15-33mmHG knee high compression stockings. The patient can follow up with our office as needed  Gerri Lins, PA-C [facility]: [phone]  12/18/2022, 3:43 PM  Clinic MD: Scot Dock

## 2022-12-25 DIAGNOSIS — Z03818 Encounter for observation for suspected exposure to other biological agents ruled out: Secondary | ICD-10-CM | POA: Diagnosis not present

## 2022-12-25 DIAGNOSIS — K529 Noninfective gastroenteritis and colitis, unspecified: Secondary | ICD-10-CM | POA: Diagnosis not present

## 2023-01-01 DIAGNOSIS — K219 Gastro-esophageal reflux disease without esophagitis: Secondary | ICD-10-CM | POA: Diagnosis not present

## 2023-02-05 ENCOUNTER — Other Ambulatory Visit: Payer: Self-pay | Admitting: Internal Medicine

## 2023-02-05 DIAGNOSIS — N029 Recurrent and persistent hematuria with unspecified morphologic changes: Secondary | ICD-10-CM | POA: Diagnosis not present

## 2023-02-05 DIAGNOSIS — R1011 Right upper quadrant pain: Secondary | ICD-10-CM | POA: Diagnosis not present

## 2023-02-05 DIAGNOSIS — N9089 Other specified noninflammatory disorders of vulva and perineum: Secondary | ICD-10-CM | POA: Diagnosis not present

## 2023-02-05 DIAGNOSIS — N9489 Other specified conditions associated with female genital organs and menstrual cycle: Secondary | ICD-10-CM | POA: Diagnosis not present

## 2023-02-06 ENCOUNTER — Other Ambulatory Visit: Payer: 59

## 2023-02-10 ENCOUNTER — Ambulatory Visit
Admission: RE | Admit: 2023-02-10 | Discharge: 2023-02-10 | Disposition: A | Discharge status: Home / Self Care | Payer: 59 | Source: Ambulatory Visit | Attending: Internal Medicine | Admitting: Internal Medicine

## 2023-02-10 DIAGNOSIS — R109 Unspecified abdominal pain: Secondary | ICD-10-CM | POA: Diagnosis not present

## 2023-02-10 DIAGNOSIS — R1011 Right upper quadrant pain: Secondary | ICD-10-CM

## 2023-02-10 MED ORDER — IOPAMIDOL (ISOVUE-300) INJECTION 61%
100.0000 mL | Freq: Once | INTRAVENOUS | Status: AC | PRN
  Administered 2023-02-10: 100 mL via INTRAVENOUS

## 2023-02-12 ENCOUNTER — Other Ambulatory Visit: Payer: Self-pay | Admitting: Allergy

## 2023-02-12 ENCOUNTER — Encounter: Payer: 59 | Attending: Psychology | Admitting: Psychology

## 2023-02-12 DIAGNOSIS — F0781 Postconcussional syndrome: Secondary | ICD-10-CM | POA: Diagnosis not present

## 2023-02-12 DIAGNOSIS — F4310 Post-traumatic stress disorder, unspecified: Secondary | ICD-10-CM | POA: Diagnosis not present

## 2023-02-12 DIAGNOSIS — G44319 Acute post-traumatic headache, not intractable: Secondary | ICD-10-CM | POA: Insufficient documentation

## 2023-02-12 DIAGNOSIS — F0670 Mild neurocognitive disorder due to known physiological condition without behavioral disturbance: Secondary | ICD-10-CM

## 2023-02-12 DIAGNOSIS — S069XAS Unspecified intracranial injury with loss of consciousness status unknown, sequela: Secondary | ICD-10-CM
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
## 2023-02-13 ENCOUNTER — Other Ambulatory Visit (HOSPITAL_COMMUNITY): Payer: Self-pay

## 2023-02-13 ENCOUNTER — Telehealth: Payer: Self-pay

## 2023-02-13 DIAGNOSIS — Z113 Encounter for screening for infections with a predominantly sexual mode of transmission: Secondary | ICD-10-CM | POA: Diagnosis not present

## 2023-02-13 NOTE — Telephone Encounter (Signed)
Patient Advocate Encounter  Prior Authorization for Pimecrolimus 1% cream has been approved through [employer].    Key: [id number]  Effective: 02-13-2023 to 02-12-2026

## 2023-02-16 DIAGNOSIS — E611 Iron deficiency: Secondary | ICD-10-CM | POA: Diagnosis not present

## 2023-02-16 DIAGNOSIS — E039 Hypothyroidism, unspecified: Secondary | ICD-10-CM | POA: Diagnosis not present

## 2023-03-12 DIAGNOSIS — Z113 Encounter for screening for infections with a predominantly sexual mode of transmission: Secondary | ICD-10-CM | POA: Diagnosis not present

## 2023-03-12 DIAGNOSIS — L739 Follicular disorder, unspecified: Secondary | ICD-10-CM | POA: Diagnosis not present

## 2023-03-13 DIAGNOSIS — Z23 Encounter for immunization: Secondary | ICD-10-CM | POA: Diagnosis not present

## 2023-03-16 DIAGNOSIS — R200 Anesthesia of skin: Secondary | ICD-10-CM | POA: Diagnosis not present

## 2023-03-16 DIAGNOSIS — R4189 Other symptoms and signs involving cognitive functions and awareness: Secondary | ICD-10-CM | POA: Diagnosis not present

## 2023-03-16 DIAGNOSIS — R202 Paresthesia of skin: Secondary | ICD-10-CM | POA: Diagnosis not present

## 2023-03-16 DIAGNOSIS — F0781 Postconcussional syndrome: Secondary | ICD-10-CM | POA: Diagnosis not present

## 2023-03-16 DIAGNOSIS — H539 Unspecified visual disturbance: Secondary | ICD-10-CM | POA: Diagnosis not present

## 2023-03-16 DIAGNOSIS — M542 Cervicalgia: Secondary | ICD-10-CM | POA: Diagnosis not present

## 2023-03-16 DIAGNOSIS — R42 Dizziness and giddiness: Secondary | ICD-10-CM | POA: Diagnosis not present

## 2023-03-17 ENCOUNTER — Encounter: Payer: Self-pay | Admitting: Allergy

## 2023-03-19 DIAGNOSIS — M67432 Ganglion, left wrist: Secondary | ICD-10-CM | POA: Diagnosis not present

## 2023-03-20 DIAGNOSIS — M542 Cervicalgia: Secondary | ICD-10-CM | POA: Diagnosis not present

## 2023-03-20 DIAGNOSIS — M792 Neuralgia and neuritis, unspecified: Secondary | ICD-10-CM | POA: Diagnosis not present

## 2023-03-26 DIAGNOSIS — F909 Attention-deficit hyperactivity disorder, unspecified type: Secondary | ICD-10-CM | POA: Diagnosis not present

## 2023-03-26 DIAGNOSIS — R1084 Generalized abdominal pain: Secondary | ICD-10-CM | POA: Diagnosis not present

## 2023-03-26 DIAGNOSIS — N898 Other specified noninflammatory disorders of vagina: Secondary | ICD-10-CM | POA: Diagnosis not present

## 2023-03-26 DIAGNOSIS — K921 Melena: Secondary | ICD-10-CM | POA: Diagnosis not present

## 2023-03-26 DIAGNOSIS — Z113 Encounter for screening for infections with a predominantly sexual mode of transmission: Secondary | ICD-10-CM | POA: Diagnosis not present

## 2023-04-08 ENCOUNTER — Encounter: Payer: 59 | Attending: Psychology | Admitting: Psychology

## 2023-04-08 DIAGNOSIS — S069XAS Unspecified intracranial injury with loss of consciousness status unknown, sequela: Secondary | ICD-10-CM | POA: Diagnosis not present

## 2023-04-08 DIAGNOSIS — F0670 Mild neurocognitive disorder due to known physiological condition without behavioral disturbance: Secondary | ICD-10-CM

## 2023-04-08 DIAGNOSIS — G44319 Acute post-traumatic headache, not intractable: Secondary | ICD-10-CM | POA: Insufficient documentation

## 2023-04-08 DIAGNOSIS — F0781 Postconcussional syndrome: Secondary | ICD-10-CM | POA: Diagnosis not present

## 2023-04-08 DIAGNOSIS — F4310 Post-traumatic stress disorder, unspecified: Secondary | ICD-10-CM

## 2023-04-28 ENCOUNTER — Encounter: Payer: Self-pay | Admitting: Psychology
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
## 2023-05-12 ENCOUNTER — Ambulatory Visit: Payer: Commercial Managed Care - HMO | Admitting: Psychology

## 2024-04-11 IMAGING — CT CT HEAD W/O CM
1 series · 15 of 30 positions shown, 19 images · non-contrast
Comparison: None Available.

CLINICAL DATA: Chronic headache



[Series 2: head w/(date) · axial · 0.46mm/px · z∈[+1005,+1145]mm · 15 of 32 slices shown, 19 images]
[im 2/32  brain]
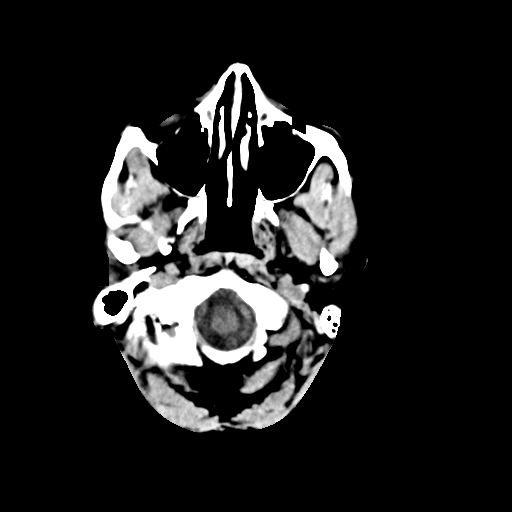
[im 2/32  bone]
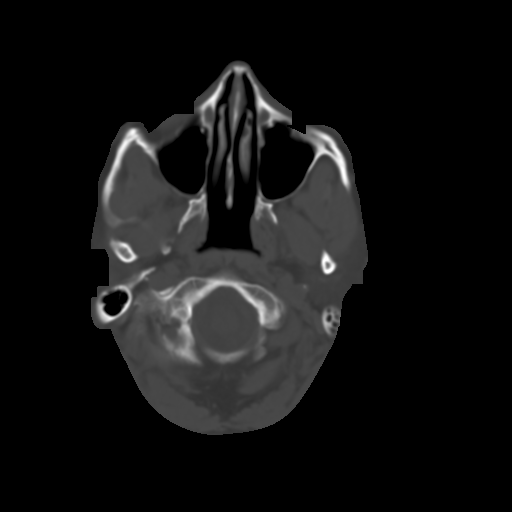
[im 4/32  brain]
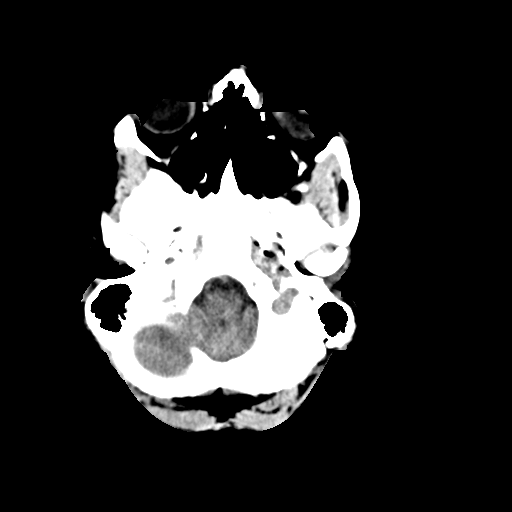
[im 6/32  brain]
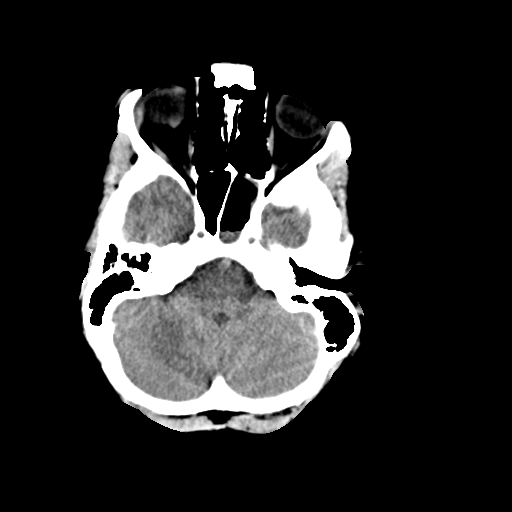
[im 8/32  brain]
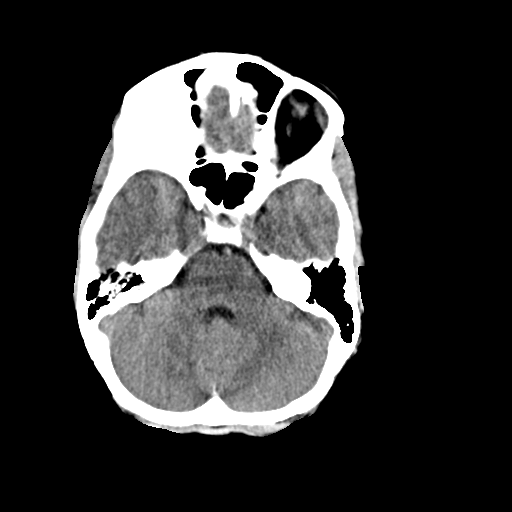
[im 10/32  brain]
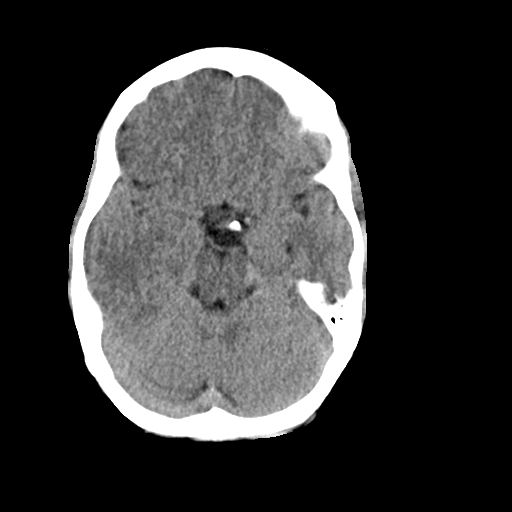
[im 10/32  bone]
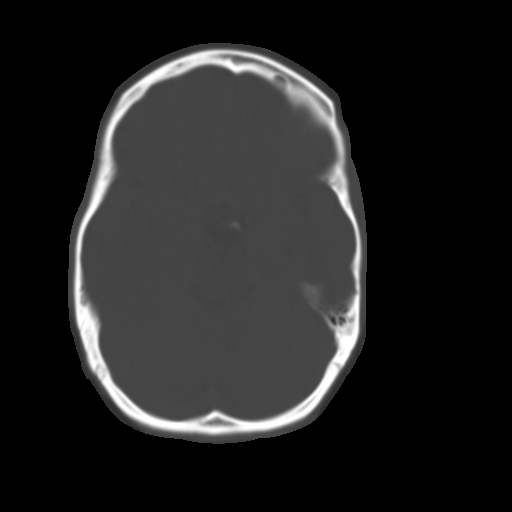
[im 12/32  brain]
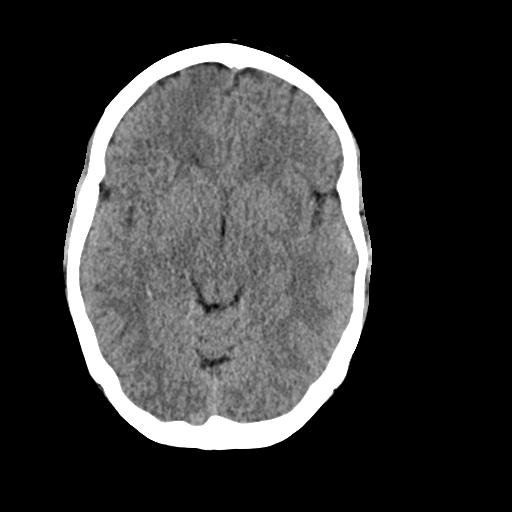
[im 14/32  brain]
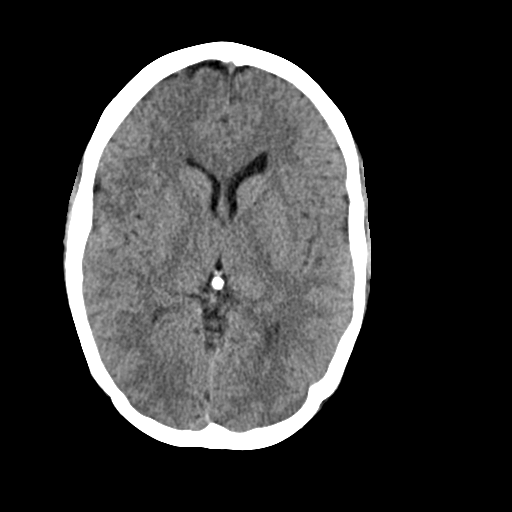
[im 17/32  brain]
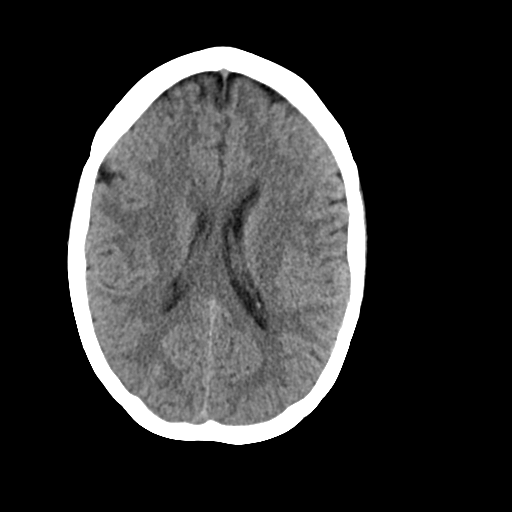
[im 18/32  brain]
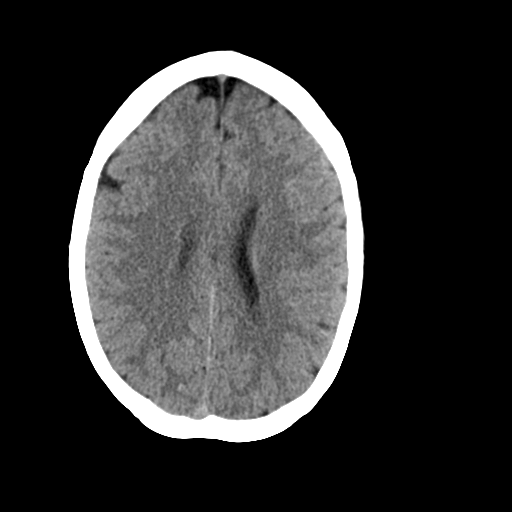
[im 18/32  bone]
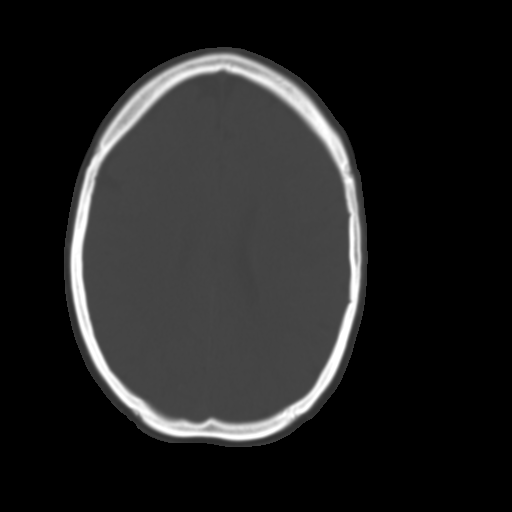
[im 20/32  brain]
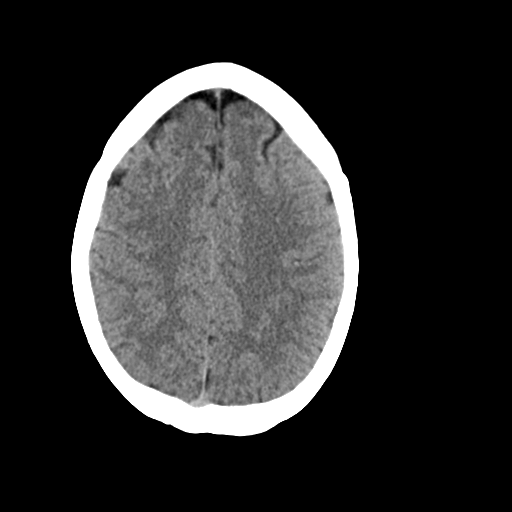
[im 22/32  brain]
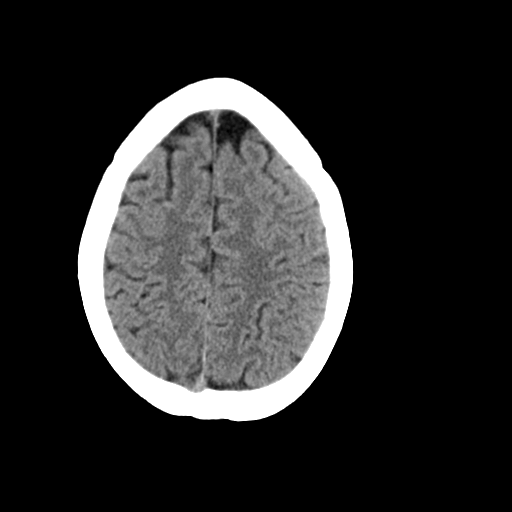
[im 24/32  brain]
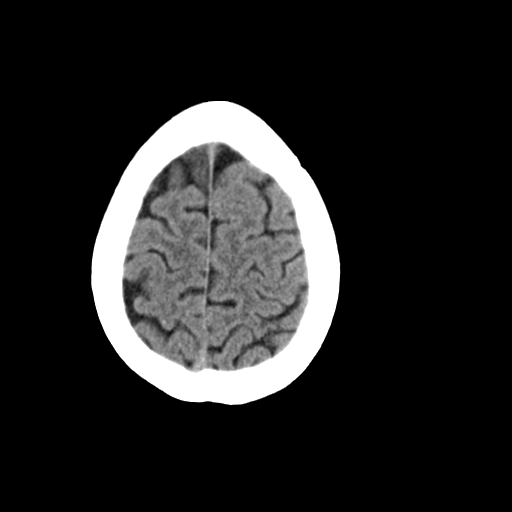
[im 26/32  brain]
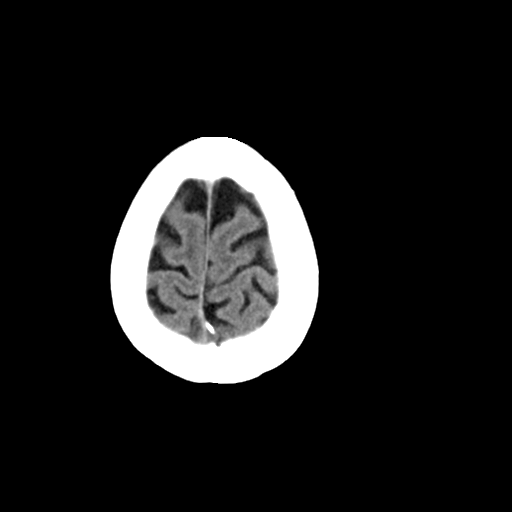
[im 26/32  bone]
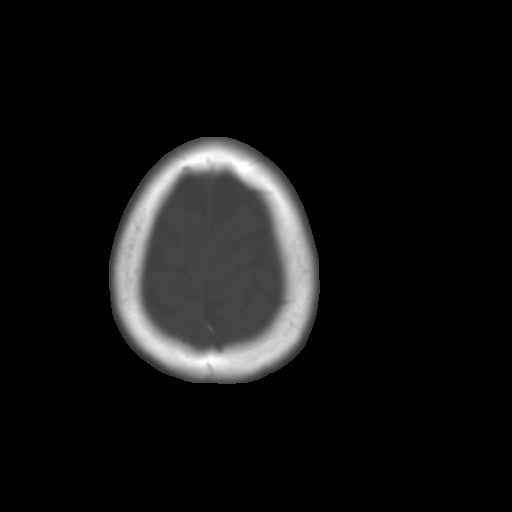
[im 28/32  brain]
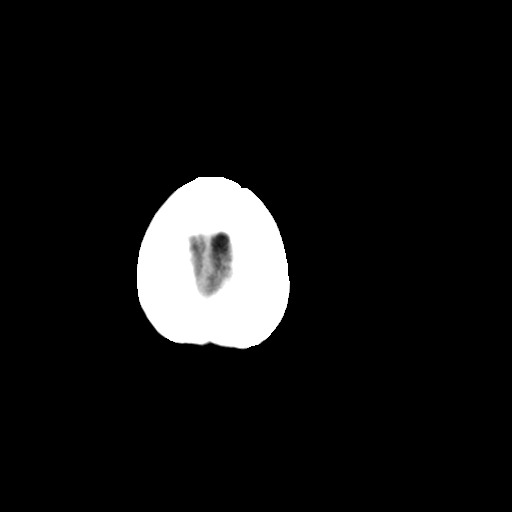
[im 30/32  brain]
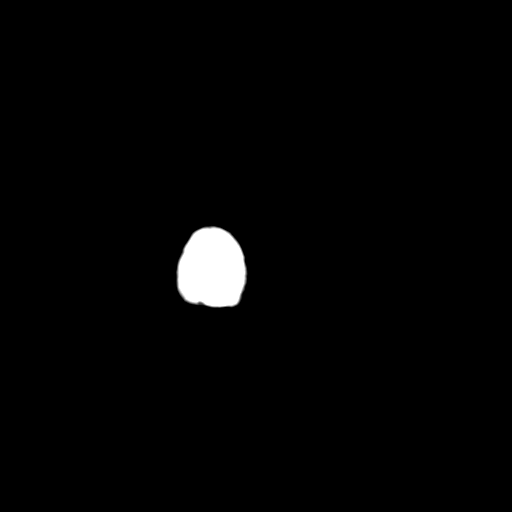

[15 of 30 positions shown; findings below may reference images not displayed]

FINDINGS: Brain: There is no mass, hemorrhage or extra-axial collection. The
size and configuration of the ventricles and extra-axial CSF spaces
are normal. The brain parenchyma is normal, without acute or chronic
infarction.

Vascular: No abnormal hyperdensity of the major intracranial
arteries or dural venous sinuses. No intracranial atherosclerosis.

Skull: The visualized skull base, calvarium and extracranial soft
tissues are normal.

Sinuses/Orbits: No fluid levels or advanced mucosal thickening of
the visualized paranasal sinuses. No mastoid or middle ear effusion.
The orbits are normal.
IMPRESSION: Normal head CT.
